# Patient Record
Sex: Female | Born: 1935
Health system: Southern US, Community
[De-identification: ages and names within clinical notes are randomized; demographics above are authoritative.]

## PROBLEM LIST (undated history)

## (undated) DIAGNOSIS — K219 Gastro-esophageal reflux disease without esophagitis: Secondary | ICD-10-CM

## (undated) DIAGNOSIS — N3281 Overactive bladder: Secondary | ICD-10-CM

## (undated) DIAGNOSIS — I2510 Atherosclerotic heart disease of native coronary artery without angina pectoris: Secondary | ICD-10-CM

## (undated) DIAGNOSIS — E782 Mixed hyperlipidemia: Secondary | ICD-10-CM

## (undated) DIAGNOSIS — R7303 Prediabetes: Secondary | ICD-10-CM

## (undated) DIAGNOSIS — I10 Essential (primary) hypertension: Secondary | ICD-10-CM

## (undated) HISTORY — DX: Prediabetes: R73.03

## (undated) HISTORY — DX: Overactive bladder: N32.81

## (undated) HISTORY — PX: CHOLECYSTECTOMY: SHX55

## (undated) HISTORY — DX: Gastro-esophageal reflux disease without esophagitis: K21.9

## (undated) HISTORY — DX: Essential (primary) hypertension: I10

## (undated) HISTORY — PX: TONSILLECTOMY: SUR1361

## (undated) HISTORY — PX: HERNIA REPAIR: SHX51

## (undated) HISTORY — PX: BACK SURGERY: SHX140

## (undated) HISTORY — PX: APPENDECTOMY: SHX54

## (undated) HISTORY — DX: Mixed hyperlipidemia: E78.2

## (undated) HISTORY — DX: Atherosclerotic heart disease of native coronary artery without angina pectoris: I25.10

---

## 2007-02-16 ENCOUNTER — Ambulatory Visit: Payer: Self-pay | Admitting: Cardiology

## 2007-02-20 ENCOUNTER — Inpatient Hospital Stay (HOSPITAL_COMMUNITY): Admission: AD | Admit: 2007-02-20 | Discharge: 2007-02-22 | Payer: Self-pay | Admitting: Cardiovascular Disease

## 2007-02-20 ENCOUNTER — Ambulatory Visit: Payer: Self-pay | Admitting: Cardiology

## 2007-02-20 ENCOUNTER — Inpatient Hospital Stay (HOSPITAL_BASED_OUTPATIENT_CLINIC_OR_DEPARTMENT_OTHER): Admission: RE | Admit: 2007-02-20 | Discharge: 2007-02-20 | Payer: Self-pay | Admitting: Cardiology

## 2007-03-10 ENCOUNTER — Ambulatory Visit: Payer: Self-pay | Admitting: Cardiology

## 2007-03-10 ENCOUNTER — Encounter: Payer: Self-pay | Admitting: Physician Assistant

## 2007-03-15 ENCOUNTER — Encounter: Payer: Self-pay | Admitting: Physician Assistant

## 2007-03-17 ENCOUNTER — Encounter: Payer: Self-pay | Admitting: Physician Assistant

## 2007-04-18 ENCOUNTER — Ambulatory Visit: Payer: Self-pay | Admitting: Cardiology

## 2007-08-03 ENCOUNTER — Encounter: Payer: Self-pay | Admitting: Cardiology

## 2007-08-25 ENCOUNTER — Ambulatory Visit: Payer: Self-pay | Admitting: Cardiology

## 2008-02-11 ENCOUNTER — Encounter: Payer: Self-pay | Admitting: Cardiology

## 2008-04-08 ENCOUNTER — Ambulatory Visit: Payer: Self-pay | Admitting: Cardiology

## 2008-04-19 ENCOUNTER — Encounter: Payer: Self-pay | Admitting: Cardiology

## 2008-05-21 ENCOUNTER — Encounter: Payer: Self-pay | Admitting: Cardiology

## 2008-10-02 ENCOUNTER — Encounter: Payer: Self-pay | Admitting: Cardiology

## 2008-11-12 DIAGNOSIS — I2510 Atherosclerotic heart disease of native coronary artery without angina pectoris: Secondary | ICD-10-CM

## 2008-11-12 DIAGNOSIS — H409 Unspecified glaucoma: Secondary | ICD-10-CM | POA: Insufficient documentation

## 2008-11-12 DIAGNOSIS — I10 Essential (primary) hypertension: Secondary | ICD-10-CM

## 2008-11-12 DIAGNOSIS — F329 Major depressive disorder, single episode, unspecified: Secondary | ICD-10-CM | POA: Insufficient documentation

## 2008-11-12 DIAGNOSIS — R0609 Other forms of dyspnea: Secondary | ICD-10-CM | POA: Insufficient documentation

## 2008-11-13 ENCOUNTER — Ambulatory Visit: Payer: Self-pay | Admitting: Cardiology

## 2008-11-13 ENCOUNTER — Encounter (INDEPENDENT_AMBULATORY_CARE_PROVIDER_SITE_OTHER): Payer: Self-pay | Admitting: *Deleted

## 2008-11-13 DIAGNOSIS — E785 Hyperlipidemia, unspecified: Secondary | ICD-10-CM

## 2008-11-21 ENCOUNTER — Encounter: Payer: Self-pay | Admitting: Physician Assistant

## 2008-11-21 ENCOUNTER — Ambulatory Visit: Payer: Self-pay | Admitting: Cardiology

## 2008-11-25 ENCOUNTER — Encounter (INDEPENDENT_AMBULATORY_CARE_PROVIDER_SITE_OTHER): Payer: Self-pay | Admitting: *Deleted

## 2008-12-19 ENCOUNTER — Ambulatory Visit: Payer: Self-pay | Admitting: Cardiology

## 2009-11-12 ENCOUNTER — Encounter: Payer: Self-pay | Admitting: Cardiology

## 2009-11-19 ENCOUNTER — Ambulatory Visit: Payer: Self-pay | Admitting: Cardiology

## 2009-11-25 ENCOUNTER — Ambulatory Visit: Payer: Self-pay | Admitting: Internal Medicine

## 2009-11-26 ENCOUNTER — Ambulatory Visit (HOSPITAL_COMMUNITY): Admission: RE | Admit: 2009-11-26 | Discharge: 2009-11-26 | Payer: Self-pay | Admitting: Internal Medicine

## 2009-12-02 ENCOUNTER — Ambulatory Visit: Payer: Self-pay | Admitting: Cardiology

## 2009-12-02 ENCOUNTER — Encounter: Payer: Self-pay | Admitting: Cardiology

## 2009-12-22 ENCOUNTER — Ambulatory Visit: Payer: Self-pay | Admitting: Cardiology

## 2010-01-05 ENCOUNTER — Ambulatory Visit: Payer: Self-pay | Admitting: Internal Medicine

## 2010-03-12 NOTE — Assessment & Plan Note (Signed)
Summary: 1 MO F/U PER 10/12 OV-JM   Visit Type:  Follow-up Primary Provider:  Dr. Kirstie Peri   History of Present Illness: 77 year old woman presents for followup. I saw her back in October. At that time she was reporting exertional fatigue, dyspnea, and wheezing with atypical chest pains. She states that since that time, symptoms have improved somewhat, although not completely resolved.  I did refer her for followup testing including PFTs and an echocardiogram, reviewed below. Results were overall reassuring.  I spoke with patient and her daughter about the possibility of considering a cardiac catheterization if her symptoms persist or progress to clearly outline the coronary anatomy and assess pulmonary pressures. At this point we were most comfortable with medical therapy and observation   Preventive Screening-Counseling & Management  Alcohol-Tobacco     Smoking Status: quit     Year Quit: 1948  Current Medications (verified): 1)  Simvastatin 10 Mg Tabs (Simvastatin) .... Take One Tablet By Mouth Daily At Bedtime 2)  Atenolol 50 Mg Tabs (Atenolol) .... Take 1 Tablet By Mouth Once A Day 3)  Amlodipine Besylate 10 Mg Tabs (Amlodipine Besylate) .... Take One Tablet By Mouth Daily 4)  Aspirin 325 Mg Tabs (Aspirin) .... Take 1 Tablet By Mouth Once A Day 5)  Vesicare 10 Mg Tabs (Solifenacin Succinate) .... Take 1 Tablet By Mouth Once A Day 6)  Vitamin B-6 100 Mg Tabs (Pyridoxine Hcl) .... Take 1 Tablet By Mouth Once A Day 7)  Fish Oil 1000 Mg Caps (Omega-3 Fatty Acids) .... Take 1 Tablet By Mouth Once A Day 8)  Red Yeast Rice 600 Mg Tabs (Red Yeast Rice Extract) .... Take 2 Tablet By Mouth Once A Day 9)  Aciphex 20 Mg Tbec (Rabeprazole Sodium) .... Take 1 Tablet By Mouth Once A Day 10)  Drisdol 98119 Unit Caps (Ergocalciferol) .... Take One By Mouth Weekly  Allergies (verified): No Known Drug Allergies  Comments:  Nurse/Medical Assistant: The patient is currently on medications  but does not know the name or dosage at this time. Instructed to contact our office with details. Will update medication list at that time.  Past History:  Past Medical History: Last updated: 12/19/2008 Hypertension  Hyperlipidemia Overactive bladder  Glaucoma  CAD - DES to LAD and circumflex January 2009, LVEF 70%  Social History: Last updated: 11/12/2008 Retired Lives at home Alcohol Use - no Drug Use - no Smoked cigarrettes as a teenager  Social History: Smoking Status:  quit  Review of Systems       The patient complains of dyspnea on exertion.  The patient denies anorexia, fever, chest pain, syncope, peripheral edema, prolonged cough, hemoptysis, melena, and hematochezia.         Otherwise reviewed and negative except as outlined.  Vital Signs:  Patient profile:   77 year old female Height:      60 inches Weight:      152 pounds Pulse rate:   67 / minute BP sitting:   134 / 83  (left arm) Cuff size:   regular  Vitals Entered By: Carlye Grippe (December 22, 2009 9:27 AM)  Physical Exam  Additional Exam:  Overweight woman in no acute distress. HEENT: Conjunctiva and lids normal, oropharynx clear. Neck: Supple, no elevated jugular venous pressure, no bruits. Lungs: Clear with diminished breath sounds, no wheezing, nonlabored. Cardiac: Regular rate and rhythm, soft systolic murmur, no S3 gallop. Abdomen: Soft, no hepatomegaly, no bruits, bowel sounds present. Skin: Warm and dry. Extremities: No  significant pitting edema, distal pulses one plus. Musculoskeletal: No gross deformities. Neuropsychiatric: Alert and oriented x3, affect appropriate.   PFT  Procedure date:  12/02/2009  Findings:      FVC 1.59, 86% predicted. FEV1 1.30, 110% predicted. Expiratory loop of flow volume loop is unremarkable. DLCO normal.  Prior Report Reviewed for Nuclear Study:  Findings: 11/21/2008 Lexiscan Cardiolite. No diagnostic ST segment changes. No perfusion defects to  indicate scar or ischemia, LVEF 80%.  Echocardiogram  Procedure date:  12/02/2009  Findings:      LVEF 60-65% with no focal wall motion abnormalities, aortic valve sclerosis without stenosis, mild mitral regurgitation, no pericardial effusion.  Impression & Recommendations:  Problem # 1:  CORONARY ATHEROSCLEROSIS NATIVE CORONARY ARTERY (ICD-414.01)  Patient reports relative symptom stability at this time on medical therapy. Recent assessment of LVEF remains normal with no major valvular abnormalities. Continue observation at this point, followup in 3 months.  Her updated medication list for this problem includes:    Atenolol 50 Mg Tabs (Atenolol) .[name]... Take 1 tablet by mouth once a day    Amlodipine Besylate 10 Mg Tabs (Amlodipine besylate) .[name]... Take one tablet by mouth daily    Aspirin 325 Mg Tabs (Aspirin) .[name]... Take 1 tablet by mouth once a day  Problem # 2:  HYPERTENSION (ICD-401.9)  No medical therapy adjustments made today.  Her updated medication list for this problem includes:    Atenolol 50 Mg Tabs (Atenolol) .[name]... Take 1 tablet by mouth once a day    Amlodipine Besylate 10 Mg Tabs (Amlodipine besylate) .[name]... Take one tablet by mouth daily    Aspirin 325 Mg Tabs (Aspirin) .[name]... Take 1 tablet by mouth once a day  Problem # 3:  SHORTNESS OF BREATH (ICD-786.05)  Recent PFTs were reassuring.  Her updated medication list for this problem includes:    Atenolol 50 Mg Tabs (Atenolol) .[name]... Take 1 tablet by mouth once a day    Amlodipine Besylate 10 Mg Tabs (Amlodipine besylate) .[name]... Take one tablet by mouth daily    Aspirin 325 Mg Tabs (Aspirin) .[name]... Take 1 tablet by mouth once a day  Patient Instructions: 1)  Your physician wants you to follow-up in: 3 months. You will receive a reminder letter in the mail one-two months in advance. If you don't receive a letter, please call our office to schedule the follow-up appointment. 2)  Your physician recommends that you continue  on your current medications as directed. Please refer to the Current Medication list given to you today.

## 2010-03-12 NOTE — Assessment & Plan Note (Signed)
Summary: 1 yr fu vs   Visit Type:  Follow-up Primary Provider:  Dr. Kirstie Peri   History of Present Illness: 77 year old woman presents for followup. She was seen back in November 2010. She is here with her daughter. Symptomatically she indicates fatigue with exertion, "wheezing" and dyspnea with exertion, atypical chest pains. Her daughter indicates that the symptoms seem more frequent. We discussed her cardiac testing from last year.  Followup labs this month showed BUN 12, creatinine 0.9, AST 28, ALT 24, cholesterol 138, triglycerides 138, HDL 50, LDL 60, sodium 138, potassium 4.5, TSH 2.2, hemoglobin 13.6, platelet was 122.  Does report indigestion and occasional cough, not typically productive. No increasing need for nitroglycerin. She reports compliance with her medications.   Current Medications (verified): 1)  Simvastatin 40 Mg Tabs (Simvastatin) .... Take One Tablet By Mouth Daily At Bedtime 2)  Atenolol 50 Mg Tabs (Atenolol) .... Take 1 Tablet By Mouth Once A Day 3)  Amlodipine Besylate 5 Mg Tabs (Amlodipine Besylate) .... Take One Tablet By Mouth Daily 4)  Aspirin 325 Mg Tabs (Aspirin) .... Take 1 Tablet By Mouth Once A Day 5)  Vesicare 10 Mg Tabs (Solifenacin Succinate) .... Take 1 Tablet By Mouth Once A Day 6)  Vitamin B-6 100 Mg Tabs (Pyridoxine Hcl) .... Take 1 Tablet By Mouth Once A Day 7)  Fish Oil 1000 Mg Caps (Omega-3 Fatty Acids) .... Take 1 Tablet By Mouth Once A Day 8)  Red Yeast Rice 600 Mg Tabs (Red Yeast Rice Extract) .... Take 2 Tablet By Mouth Once A Day 9)  Aciphex 20 Mg Tbec (Rabeprazole Sodium) .... Take 1 Tablet By Mouth Once A Day 10)  Drisdol 64332 Unit Caps (Ergocalciferol) .... Take One By Mouth Weekly  Allergies (verified): No Known Drug Allergies  Comments:  Nurse/Medical Assistant: The patient's medication bottles and allergies were reviewed with the patient and were updated in the Medication and Allergy Lists.  Past History:  Past Medical  History: Last updated: 12/19/2008 Hypertension  Hyperlipidemia Overactive bladder  Glaucoma  CAD - DES to LAD and circumflex January 2009, LVEF 70%  Social History: Last updated: 11/12/2008 Retired Lives at home Alcohol Use - no Drug Use - no Smoked cigarrettes as a teenager  Review of Systems  The patient denies anorexia, fever, weight loss, syncope, headaches, hemoptysis, melena, and hematochezia.         Otherwise reviewed and negative.  Vital Signs:  Patient profile:   77 year old female Height:      60 inches Weight:      152 pounds BMI:     29.79 Pulse rate:   60 / minute BP sitting:   147 / 93  (left arm) Cuff size:   regular  Vitals Entered By: Carlye Grippe (November 19, 2009 9:51 AM)  Nutrition Counseling: Patient's BMI is greater than 25 and therefore counseled on weight management options.  Physical Exam  Additional Exam:  Overweight woman in no acute distress. HEENT: Conjunctiva and lids normal, oropharynx clear. Neck: Supple, no elevated jugular venous pressure, no bruits. Lungs: Clear with diminished breath sounds, no wheezing, nonlabored. Cardiac: Regular rate and rhythm, soft systolic murmur, no S3 gallop. Abdomen: Soft, no hepatomegaly, no bruits, bowel sounds present. Skin: Warm and dry. Extremities: No significant pitting edema, distal pulses one plus. Musculoskeletal: No gross deformities. Neuropsychiatric: Alert and oriented x3, affect appropriate.   Echocardiogram  Procedure date:  11/21/2008  Findings:      Normal LV chamber size with  LVEF 65%, no wall motion abnormalities, mild mitral annular calcification with trace mitral regurgitation, RVSP 21 mm mercury.  Nuclear Study  Procedure date:  11/21/2008  Findings:      [employer]. No diagnostic ST segment changes. No perfusion defects to indicate scar or ischemia, LVEF 80%.  EKG  Procedure date:  11/19/2009  Findings:      Sinus bradycardia at 59 beats per minute with  nonspecific ST changes.  Impression & Recommendations:  Problem # 1:  SHORTNESS OF BREATH (ICD-786.05)  Progressive since last visit. Reportedly occasional "wheezing" also indigestion symptoms. We reviewed her previous ischemic workup from last October. Plan at this point is to proceed with a 2-D echocardiogram to reassess cardiac structure and function, and also obtain PFTs. I plan to bring her back to the office to discuss the results over the next month.  Her updated medication list for this problem includes:    Atenolol 50 Mg Tabs (Atenolol) .[name]... Take 1 tablet by mouth once a day    Amlodipine Besylate 10 Mg Tabs (Amlodipine besylate) .[name]... Take one tablet by mouth daily    Aspirin 325 Mg Tabs (Aspirin) .[name]... Take 1 tablet by mouth once a day  Orders: Pulmonary Function Test (PFT) 2-D Echocardiogram (2D Echo)  Problem # 2:  DYSLIPIDEMIA (ICD-272.4)  Lipid numbers are well controlled overall. Based on FDA dosing recommendations, will reduce simvastatin to 10 mg daily. Followup fasting lipid profile and liver function tests in the next 12 weeks.  Her updated medication list for this problem includes:    Simvastatin 10 Mg Tabs (Simvastatin) .[name]... Take one tablet by mouth daily at bedtime  Problem # 5:  HYPERTENSION (ICD-401.9)  Increase amlodipine to 10 mg daily. May also act as an antianginal.  Her updated medication list for this problem includes:    Atenolol 50 Mg Tabs (Atenolol) .[name]... Take 1 tablet by mouth once a day    Amlodipine Besylate 10 Mg Tabs (Amlodipine besylate) .[name]... Take one tablet by mouth daily    Aspirin 325 Mg Tabs (Aspirin) .[name]... Take 1 tablet by mouth once a day  Problem # 6:  CORONARY ATHEROSCLEROSIS NATIVE CORONARY ARTERY (ICD-414.01)  Status post DES to the LAD and circumflex in January 2009. Cardiolite from last October demonstrated no evidence of scar or ischemia  Her updated medication list for this problem includes:    Atenolol 50 Mg Tabs  (Atenolol) .[name]... Take 1 tablet by mouth once a day    Amlodipine Besylate 10 Mg Tabs (Amlodipine besylate) .[name]... Take one tablet by mouth daily    Aspirin 325 Mg Tabs (Aspirin) .[name]... Take 1 tablet by mouth once a day  Orders: EKG w/ Interpretation (93000) 2-D Echocardiogram (2D Echo)  Patient Instructions: 1)  Follow up with Dr. [name] on Monday, December 22, 2009 at 9:20am. 2)  Your physician has requested that you have an echocardiogram.  Echocardiography is a painless test that uses sound waves to create images of your heart. It provides your doctor with information about the size and shape of your heart and how well your heart's chambers and valves are working.  This procedure takes approximately one hour. There are no restrictions for this procedure. 3)  Your physician has recommended that you have a pulmonary function test.  Pulmonary Function Tests are a group of tests that measure how well air moves in and out of your lungs. 4)  Decrease Zocor (simvastatin) to 10mg  by mouth at bedtime. You will need a new prescription  for this. 5)  Increase Norvasc (amlodipine) to 10mg  by mouth once daily. You may take 2 of your 5 mg tablets until gone and then prescription filled for 10mg  tablets. Prescriptions: AMLODIPINE BESYLATE 10 MG TABS (AMLODIPINE BESYLATE) Take one tablet by mouth daily  #30 x 6   Entered by:   Cyril Loosen, RN, BSN   Authorized by:   Loreli Slot, MD, [facility]   Signed by:   Cyril Loosen, RN, BSN on 11/19/2009   Method used:   Electronically to        [facility]* (retail)       [address]       [place], [place]  [address]       Ph: [phone]       Fax: [phone]   RxID:   [id number] SIMVASTATIN 10 MG TABS (SIMVASTATIN) Take one tablet by mouth daily at bedtime  #30 x 6   Entered by:   Cyril Loosen, RN, BSN   Authorized by:   Loreli Slot, MD, [facility]   Signed by:   Cyril Loosen, RN, BSN on 11/19/2009   Method  used:   Electronically to        [facility]* (retail)       [address]       [place], [place]  [address]       Ph: [phone]       Fax: [phone]   RxID:   [id number]

## 2010-05-18 ENCOUNTER — Ambulatory Visit (INDEPENDENT_AMBULATORY_CARE_PROVIDER_SITE_OTHER): Payer: Medicare Other | Admitting: Internal Medicine

## 2010-05-18 DIAGNOSIS — K219 Gastro-esophageal reflux disease without esophagitis: Secondary | ICD-10-CM

## 2010-06-23 NOTE — Cardiovascular Report (Signed)
[name], [name]                ACCOUNT NO.:  [id number]   MEDICAL RECORD NO.:  [id number]          PATIENT TYPE:  INP   LOCATION:  2807                         FACILITY:  [facility]   PHYSICIAN:  Everardo Beals. Juanda Chance, MD, FACCDATE OF BIRTH:  Mar 17, 1925   DATE OF PROCEDURE:  02/21/2007  DATE OF DISCHARGE:                            CARDIAC CATHETERIZATION   CLINICAL HISTORY:  Belinda [name] is 77 years old and was seen in the [facility]  office with symptoms of shortness of breath.  Her symptoms were  suggestive of angina.  She is scheduled for evaluation with  catheterization at [facility] which showed 50% narrowing in the  distal left main, 95% narrowing in the proximal LAD and 70-80% narrowing  in the proximal circumflex artery.  We considered surgery but chose to  treat her percutaneously today.   PROCEDURE:  The procedure was performed by the right femoral artery  using an arterial sheath and 6-French CLS 3.5 guiding catheter with side  holes.  The patient given Angiomax bolus and infusion and been given  chewable aspirin and an extra 300 mg of Plavix.  A Prowater wire was  advanced down the LAD across the lesion without difficulty.  We  predilated the lesion with a 2.25 x 15-mm Maverick, performing two  inflations up to 8 atmospheres for 20 seconds.  We then deployed a 2.5 x  15-mm Promus stent, deploying this with one inflation of 14 atmospheres  for 20 seconds.  We then postdilated with a 2.75 x 12-mm Quantum  Maverick performing two inflations up to 16 atmospheres for about 20  seconds.  This did cross the first diagonal branch but did not  compromise flow in the diagonal branch.   We then approached the circumflex artery.  We passed a Prowater wire  down the circumflex artery across the lesion.  We predilated with 2.25 x  15-mm Maverick performing two inflations up to 12 atmospheres for 20  seconds to get complete expansion of the balloon.  We then deployed a  2.5 x 8-mm Promus  stent deploying this with one inflation of 14  atmospheres for 20 seconds.  Final diagnostic studies were then  performed though the guiding catheter.   The right femoral artery was closed with Angio-Seal at the end of the  procedure.  The patient tolerated the procedure well and left the  laboratory in satisfactory condition.   RESULTS:  Initially the stenosis in the proximal LAD was estimated at  95%.  Following stenting, this improved to 0%.   Initially the stenosis in the proximal circumflex artery was estimated  at 80%.  Following stenting, this improved to 0%.   CONCLUSION:  1. Successful percutaneous coronary intervention of the lesion in the      proximal left anterior descending artery using a Promus drug-      eluting stent with improvement in percent of narrowing from 90% 0%.  2. Successful percutaneous coronary intervention of the lesion in the      proximal circumflex artery using a Promus drug-eluting stent with      improvement in  percent of narrowing from 80% to 0%.   DISPOSITION:  The patient was transferred to the post angioplasty unit  for further observation.  I recommend Plavix for at least 1 year.      Bruce Elvera Lennox Juanda Chance, MD, [facility]  Electronically Signed     BRB/MEDQ  D:  02/21/2007  T:  02/21/2007  Job:  [id number]   cc:   Belinda Peri, MD  Belinda Codding, MD,FACC  Belinda Sidle, MD  Cardiopulmonary Lab

## 2010-06-23 NOTE — Assessment & Plan Note (Signed)
[facility]                          [facility] OFFICE NOTE   BREANE, GRUNWALD                       MRN:          [id number]  DATE:04/08/2008                            DOB:          30-Nov-1925    PRIMARY CARE PHYSICIAN:  Kirstie Peri, MD   REASON FOR VISIT:  Routine followup.   HISTORY OF PRESENT ILLNESS:  Ms. Henrickson comes back for a 41-month visit.  She is here with her daughter and denies having any progressive  exertional angina, although she has really not been exercising much  since the passing of her husband last year.  She still seems to be  dealing with grief.  Her electrocardiogram today shows sinus rhythm with  a prolonged PR interval of 214 milliseconds, relatively stable compared  to the last tracing.  Her medications were reviewed.  She is now no  longer on Plavix but continues with a full-dose aspirin.  She is not  having any bleeding problems.  Her lipid numbers from last June looked  fairly good with an LDL of 72 and normal liver function tests.  Today,  we talked about trying to get back to a regular exercise regimen, either  through the [facility] or cardiac rehabilitation.  She is also due for  followup lipid profile.   ALLERGIES:  No known drug allergies.   PRESENT MEDICATIONS:  1. Atenolol 50 mg p.o. daily.  2. Travatan eye drops.  3. Multivitamin.  4. Omega 3 supplements 1000 mg daily.  5. Red yeast rice extract.  6. Aspirin 325 mg p.o. daily.  7. Simvastatin 40 mg p.o. daily.  8. VESIcare 10 mg p.o. nightly.  9. Amlodipine 2.5 mg p.o. daily.  10.Sublingual nitroglycerin 0.4 mg p.r.n.   REVIEW OF SYSTEMS:  As outlined above.  Otherwise negative.   PHYSICAL EXAMINATION:  VITAL SIGNS:  Blood pressure is 130/780, heart  rate is 68, weight is 151 pounds which is up from 147 in July 2009.  GENERAL:  She is in no acute distress.  HEENT:  Conjunctiva is normal.  Oropharynx clear.  NECK:  Supple.  No elevated jugular  venous pressure.  No audible bruits.  No thyromegaly is noted.  LUNGS:  Clear without labored breathing at rest.  CARDIAC:  Regular rate and rhythm.  No loud murmur or gallop.  ABDOMEN:  Soft, nontender.  Normoactive bowel sounds.  EXTREMITIES:  Exhibits no significant pitting edema.  Distal pulses are  2+.  SKIN:  Warm and dry.  MUSCULOSKELETAL:  No kyphosis noted.  NEUROPSYCHIATRIC:  The patient is alert and oriented x3.  Affect is  appropriate.   IMPRESSION AND RECOMMENDATIONS:  1. Cardiovascular disease status post drug-eluting stent placement to      the proximal left anterior descending as well as proximal      circumflex in January 2009, associated with an ejection fraction of      70%.  Ms. Musleh is symptomatically stable, and we will plan to      continue medical therapy with followup in 6 months.  We did talk  about her basic exercise regimen, and she will look at either going      back to the [facility] more regularly or perhaps joining the cardiac      rehabilitation maintenance program.  She is not having to use any      nitroglycerin at this time.  2. Hyperlipidemia, fairly well controlled as of her last lipid profile      in June 2009.  We will plan a repeat liver function tests and      lipids.     [name], MD  Electronically Signed    SGM/MedQ  DD: 04/08/2008  DT: 04/09/2008  Job #: [id number]   cc:   Kirstie Peri, MD

## 2010-06-23 NOTE — Assessment & Plan Note (Signed)
[facility]                          [facility] NOTE   OLIVINE, HIERS                       MRN:          [id number]  DATE:04/18/2007                            DOB:          08-31-1925    PRIMARY CARE PHYSICIAN:  Kirstie Peri, MD   REASON FOR VISIT:  Cardiac followup.   HISTORY OF PRESENT ILLNESS:  Ms. Rosenstock was in the office back in  January.  Her history is detailed in the previous note.  She reports  generally an improvement in her baseline dyspnea on exertion and states  that she has [place]  She is not having any anginal chest pain.  She was  referred for followup pulmonary testing which was overall reassuring.  Her PFTs showed normal airway flow rates with mild restriction and mild  decrease in DLCO.  Her D-dimer was within normal range at 0.36.  She had  a chest x-ray done revealing no infiltrate.  She did have minimal  nodularity in the left lung apex which was felt to represent probable  small granulomas.  She has had no cough or hemoptysis.  I suspect this  may well be a chronic finding.  Today, we talked about her medications  and also some titration for better blood pressure control.   ALLERGIES:  NO KNOWN DRUG ALLERGIES.   MEDICATIONS:  1. Atenolol 50 mg p.o. daily.  2. Travatan eye drops.  3. Imipramine 25 mg p.o. t.i.d.  4. Vitamin C and B supplements.  5. Omega 3 supplements 1000 mg p.o. daily.  6. Aspirin 81 mg p.o. daily.  7. Plavix 75 mg p.o. daily.  8. Simvastatin 40 mg p.o. daily.  9. Antivert p.r.n.   REVIEW OF SYSTEMS:  As described in history of present.  She has  occasional dizziness.  No palpitations or syncope.  Otherwise systems  are negative.   PHYSICAL EXAMINATION:  Blood pressure 148/90, down to 131/80, heart rate  is 71, weight 143 pounds.  The patient is comfortable and in acute  distress.  HEENT:  Conjunctiva is normal.   Oropharynx is clear.  NECK: Neck is supple.  No elevated jugular venous pressure. No loud  bruits or thyromegaly is noted.  LUNGS:  Normal breath sounds.  No rales or wheezing.  CARDIAC:  Reveals a regular rate and rhythm.  No loud murmur or S3  gallop.  ABDOMEN:  Soft, nontender, normoactive bowel sounds.  EXTREMITIES: Show no pitting edema.  Distal pulses are 2+.  SKIN:  Warm and dry.  MUSCULOSKELETAL:  No kyphosis noted.  NEUROPSYCHIATRIC:  The patient is alert and oriented x3. Affect is  appropriate.   IMPRESSION AND RECOMMENDATIONS:  1. Multivessel coronary artery disease status post a drug-eluting      stents to the proximal left anterior descending and proximal      circumflex with hyperdynamic left ventricular ejection fraction of      70%.  At this point, we plan to continue medical therapy and I will      also add Norvasc  2.5 mg daily for better blood pressure control.      If she has any underlying diastolic dysfunction this might also be      beneficial from a symptom perspective.  I will plan to see her back      over the next 3 months.  2. The patient had a followup right groin ultrasound due to findings      of a bruit.  There was no evidence of pseudoaneurysm.  The patient      has had no pain in this area and is ambulating well.  3. History of mild hypoxemia on room air and pulmonary arterial      hypertension at catheterization.  Normal D-dimer argues against      chronic thromboembolic disease and her PFTs showed only a mild      restrictive defect.  I suspect her chest x-ray findings are      chronic.  She is not having any cough or hemoptysis.  We will plan      a follow-up chest x-ray around the time of her next visit.  She has      a remote history of tobacco use.     [name], MD  Electronically Signed    SGM/MedQ  DD: 04/18/2007  DT: 04/18/2007  Job #: [id number]   cc:   Kirstie Peri, MD

## 2010-06-23 NOTE — Assessment & Plan Note (Signed)
[facility]                          [facility] NOTE   DELTA, PICHON                       MRN:          [id number]  DATE:08/25/2007                            DOB:          [birth year]    PRIMARY CARE PHYSICIAN:  Kirstie Peri, MD   REASON FOR VISIT:  Routine followup.   HISTORY OF PRESENT ILLNESS:  Ms. Stonesifer comes back in for a 64-month  visit.  Unfortunately, her husband, Jonny Ruiz, passed away recently in late  08/11/2022 and she is obviously still grieving from this.  She is not  reporting any obvious new problems with angina or progressive  breathlessness.  She did have a followup chest x-ray in late 2022-08-11  demonstrating stable findings with emphysematous and chronic  interstitial changes.  She has been tolerating her medicines well.  I  did ask about duration of Plavix.  She had drug-eluting stents placed in  January of this year.  We would anticipate continuing at least through  January of next year.  She is not having any bleeding problems.  It does  seem that she has good family and friend support at this time.   ALLERGIES:  No known drug allergies.   PRESENT MEDICATIONS:  1. Atenolol 50 mg p.o. daily.  2. Travatan eye drops.  3. Vitamin C and B supplements.  4. Multivitamin.  5. Omega-3 supplements 1000 mg p.o. daily.  6. Red yeast rice extract.  7. Aspirin 325 mg p.o. daily.  8. Plavix 75 mg p.o. daily.  9. Simvastatin 40 mg p.o. daily.  10.Vesicare 10 mg p.o. at bedtime.  11.Amlodipine 2.5 mg p.o. daily.   REVIEW OF SYSTEMS:  As described in the history of present illness.  Otherwise, negative.   PHYSICAL EXAMINATION:  Blood pressure is 130/81, heart rate is 65, and  weight is 147 pounds.  The patient is comfortable in no acute distress.  Examination of the neck reveals no elevated jugular venous pressure.  No  loud bruits.  No thyromegaly is noted.  Lungs clear, somewhat coarse  breath sounds.  Nonlabored breathing.  No  wheezing.  Cardiac exam  reveals a regular rate and rhythm.  No pathologic murmurs or S3 gallop.  Extremities exhibit no frank pitting edema.   IMPRESSION AND RECOMMENDATIONS:  1. Multi-vessel cardiovascular disease status post drug-eluting stent      placement to the proximal left anterior descending as well as      proximal circumflex back in January 2009.  She has a left      ventricular ejection fraction of approximately 70%.  We will plan      to continue medical therapy at this point.  I will see her back for      symptom observation in 6 months.  2. History of chronic lung disease with no change in baseline      shortness of breath.  Followup chest x-ray in late 08-11-22 was stable.     [name], MD  Electronically Signed    SGM/MedQ  DD: 08/25/2007  DT: 08/25/2007  Job #: [id number]  cc:   Kirstie Peri, MD

## 2010-06-23 NOTE — Assessment & Plan Note (Signed)
[facility]                          Belinda [name]   Belinda [name], Belinda [name]                       MRN:          [id number]  DATE:03/10/2007                            DOB:          1926/01/04    PRIMARY CARDIOLOGIST:  Belinda [name].   REASON FOR VISIT:  Post hospital follow-up.   Belinda [name] is a very pleasant 77 year old female, with no prior history  of  coronary artery disease, who was recently seen here in the clinic by  Belinda [name],  Belinda [name] and Belinda [name].  She was referred to [place] by Dr.  Sherryll [name] for evaluation of exertional dyspnea with associated chest  discomfort.  Her symptoms were quite worrisome  for significant  underlying coronary artery disease and she was thus referred for an  elective cardiac catheterization.   The coronary angiogram, performed by Belinda [name], yielded  multivessel CAD with hyperdynamic LVF (70%), with no focal wall motion  abnormalities.  Of [name], there was also indication of pulmonary arterial  hypertension with associated  hypoxemia of 88-90%, on room air.  Given  these findings, Belinda [name] suspected that there was also an element of  underlying pulmonary disease and suggested possible chest CT angiography  to exclude chronic thromboembolic disease.   The patient was subsequently deemed a suitable candidate for  percutaneous intervention and thus underwent successful PCI, by Dr.  Charlies [name], with placement of 2 Belinda [name] drug-eluting stents for  treatment of a 95% focal proximal LAD and an 80% focal proximal CFX  stenosis.   Notably, there was residual 50% distal left main stenosis and  nonobstructive RCA disease.   The patient was discharged home the following day, with recommendations  to pursue additional workup for pulmonary status, with PFTs and a 6-  minute walk.   Clinically, Ms. Mclelland seems to suggest some mild improvement in her  level of exertional dyspnea, following her recent  percutaneous  intervention.  She continues to deny any appreciable associated chest  discomfort, however.  She is tolerating her medications, denies any  obvious complication of right groin incision site, and notes no easy  bruisability.   The patient does report a prior history of smoking, but states that this  was only done intermittently while she was a teenager.   CURRENT MEDICATIONS:  1. Atenolol 50 daily  2. Plavix  3. Aspirin 325 daily  4. Simvastatin 40 daily  5. Red yeast rice.  6. Fish oil 1000 daily  7. Imipramine 25 mg t.i.d.  8. Glaucoma eyedrops.   PHYSICAL EXAMINATION:  Blood pressure 138/78, pulse 66, regular weight  145.  GENERAL:  A 77 year old female sitting upright in no distress.  HEENT:  Normocephalic, atraumatic.  NECK:  Palpable carotid pulse without bruits; no JVD.  LUNGS:  Clear to auscultation all fields.  HEART:  Regular rate and rhythm (S1-S2).  No significant murmurs.  No  rubs.  ABDOMEN:  Soft, nontender with intact bowel sounds.  EXTREMITIES:  Right groin stable with no evidence of hematoma or  ecchymosis.  Palpable femoral pulse with a soft  bruit; no bruit on the  left.  Intact peripheral pulses with no edema.  NEURO:  No focal deficit  pressure.   ASSESSMENT:  1. Multivessel coronary artery disease      a.     Status post recent drug-eluting stenting (Belinda [name]) of       proximal LAD and proximal CFX.      b.     Hyperdynamic LVF (70%), with no focal wall motion       abnormalities.      c.     Transient hypoxemia, question pulmonary arterial       hypertension.  2. Right groin bruit.  3. Dyslipidemia.  4. Hypertension.  5. Remote tobacco.   PLAN:  1. Ultrasound of the right groin today, for further evaluation of      noted bruit and rule out of pseudoaneurysm.  2. Proceed with a pulmonary evaluation with baseline PFTs and 6-minute      walk with pre/post exercise room air saturation levels.  We will      also check a D-dimer level  and, if positive, consider further      evaluation with a CT angiogram of the chest to rule out chronic      thromboembolic disease..  3. Follow-up of fasting lipid/liver profile in 12 weeks.  4. Patient encouraged to participate in the cardiac rehab program, as      already recommended.  5. Schedule return clinic follow-up with myself and Belinda [name]      in 1 month, for review of study results and further      recommendations.      Gene Serpe, PA-C  Electronically Signed      Learta Codding, MD,FACC  Electronically Signed   GS/MedQ  DD: 03/10/2007  DT: 03/10/2007  Job #: [id number]   cc:   Kirstie Peri, MD

## 2010-06-23 NOTE — Discharge Summary (Signed)
NAMEKEEANNA, Belinda [name]                ACCOUNT NO.:  [id number]   MEDICAL RECORD NO.:  [id number]          PATIENT TYPE:  INP   LOCATION:  6524                         FACILITY:  [facility]   PHYSICIAN:  [name], MD DATE OF BIRTH:  05/29/1925   DATE OF ADMISSION:  02/20/2007  DATE OF DISCHARGE:  02/22/2007                               DISCHARGE SUMMARY   PRIMARY CARDIOLOGIST:  [name], M.D.   PRIMARY CARE Arlana Canizales:  Kirstie Peri, M.D. in [place].   DISCHARGE DIAGNOSIS:  Unstable angina/coronary artery disease.   SECONDARY DIAGNOSES:  1. Hypertension.  2. Hyperlipidemia.  3. Overactive bladder block.  4. Glaucoma.  5. Status post cholecystectomy.  6. Status post tonsillectomy.  7. Status post appendectomy.  8. History of lumbar spinal surgery secondary to herniated nucleus      pulposus.   ALLERGIES:  No known drug allergies.   PROCEDURES:  Left heart cardiac catheterization with successful  percutaneous coronary intervention and stenting of the left anterior  descending and left circumflex with placement of Promus drug-eluting  stents in each vessel.   HISTORY OF PRESENT ILLNESS:  A 76 year old Caucasian female without  prior history of coronary artery disease who was recently seen in clinic  by Dr. Simona Huh inn January 2009 secondary to a several month  history of progressive dyspnea on exertion with occasional chest  tightness.  Decision was made to pursue right and left heart cardiac  catheterization.   HOSPITAL COURSE:  The patient underwent right and left heart cardiac  catheterization on February 20, 2007, revealing a 90% stenosis in the  proximal LAD as well as an 80% stenosis in the left circumflex and  nonobstructive disease in the RCA.  EF was approximately 70% without  regional wall motion abnormalities.  Right heart pressures were mildly  elevated with a PA of 42/19 and a mean of 28.  Pulmonary artery  saturation was 54% while arterial  saturation was 90%.  Films were  reviewed by Dr. Juanda Chance and decision was made to pursue PCI and stenting  of the LAD and circumflex.  The LAD was successfully stented with a 2.5  x 50 mm Promus drug-eluting stent while the circumflex received a 2.5 x  8 mm Promus drug-eluting stent.  She tolerated this procedure well and  postprocedure cardiac markers have remained normal.   We will plan to discharge her home today in satisfactory condition with  plan for followup in our [place] office in approximately 2 weeks.  Given  documented relative hypoxia on right heart catheterization with mild to  moderate pulmonary hypertension, we will also plan to set up outpatient  Pulmonary evaluation in [facility] with a 6-minute walk and PFTs.   Belinda [name] will be discharged home today in good condition.   DISCHARGE LABS:  Hemoglobin 13.1, hematocrit 39.0, WBC 6.7, platelets  134, sodium 139, potassium 4.3, chloride 104, CO2 29, BUN 8, creatinine  0.96, glucose 106, INR 1.0, total bilirubin 0.5, alkaline phosphatase  72, AST 22, ALT 18, albumin 3.3, CK 52, MB 1.9, troponin 0.02, total  cholesterol 165, triglycerides  119, HDL 29, LDL 112, calcium 8.8.   DISPOSITION:  The patient is being discharged home today in good  condition.   FOLLOWUP APPOINTMENTS:  She has followup with Dr. Andee Lineman in our [place]  office on January 30, at 10:30 a.m.  She has followup with Dr. Sherryll Burger as  scheduled.  We will work on arranging Pulmonary followup when she  returns for Cardiology followup.   DISCHARGE MEDICATIONS:  1. Aspirin 325 mg daily.  2. Plavix 75 mg daily.  3. Zocor 40 mg q.h.s.  4. Atenolol 50 mg daily.  5. Imipramine 25 mg t.i.d.  6. Multivitamin one daily.  7. Fish oil 1000 mg daily.  8. Travatan 0.004% one drop both eyes q.h.s.  9. Dorzolamide 2% one drop in both eyes b.i.d.  10.Vitamin C daily.  11.Vitamin B-6 daily.  12.Red yeast rice two tabs daily.  13.Antivert 25 mg t.i.d. p.r.n.  14.Nitroglycerin 0.4 mg  sublingual p.r.n. chest pain.   OUTSTANDING LAB STUDIES:  None.   DURATION DISCHARGE ENCOUNTER:  45 minutes including physician time.      Nicolasa Ducking, ANP      [name], MD  Electronically Signed    CB/MEDQ  D:  02/22/2007  T:  02/22/2007  Job:  [id number]   cc:   Kirstie Peri, MD

## 2010-06-23 NOTE — Assessment & Plan Note (Signed)
[name]                          [facility] NOTE   Belinda [name]                       MRN:          [id number]  DATE:02/16/2007                            DOB:          August 09, 1925    REFERRING PHYSICIAN:  Ruthy Dick   PRIMARY CARE PHYSICIAN:  Dr. Kirstie Peri.   REASON FOR REFERRAL:  Shortness of breath.   HISTORY OF PRESENT ILLNESS:  [name] is a very pleasant 77 year old  female patient, wife of Belinda [name], who is followed in our practice,  who presents to the office today for further evaluation of dyspnea with  exertion.   [name] notes significant change in her level of dyspnea on exertion  over the last several months.  Her family has also noticed this.  She  describes imaging class IIB to III symptoms.  Her family notes that with  just walking out get her newspaper, she has to stop several times to  catch her breath.  She is also had to stop several times while shopping  in the grocery store.  She does note some heaviness with exertion in her  chest accompanied by shortness of breath.  She denies any radiation to  her arm or jaw.  She also notes some chest tightness from time to time.  This seems to be located bilaterally.  Seems to be associated with  lifting and taking care of her husband.  She has awoke in the middle of  the night with this before, but again it is different from the symptoms  that she feels with shortness of breath.  She denies syncope, near-  syncope.  She denies palpitations.  She denies orthopnea, PND, pedal  edema.   PAST MEDICAL HISTORY:  1. Hypertension diagnosed some 2 years ago.  2. Hyperlipidemia.  She wants to avoid statins if at all possible.  Is      currently taking red yeast rice and fish oil.  3. Overactive bladder.  4. Glaucoma.  5. Status post cholecystectomy.  6. Status post tonsillectomy.  7. Status post appendectomy.  8. History of lumbar spinal surgery secondary  to herniated nucleus      pulposus.   CURRENT MEDICATIONS:  1. Atenolol 50 mg daily.  2. Travatan eye drops.  3. Dorzolamide eye drops.  4. Imipramine 25 mg 3 times a day.  5. Vitamin C.  6. B6.  7. Multivitamin.  8. Fish oil 1000 mg daily.  9. Red yeast rice daily.  10.Aspirin p.r.n.  11.Antivert p.r.n.   ALLERGIES:  No known drug allergies.  She denies any allergies to  contrast media.   SOCIAL HISTORY:  She admits to smoking cigarettes remotely as a teenager  but has not been a long-time smoker.  She denies alcohol abuse.  She is  married.  Lives in [place] and is retired Geophysicist/field seismologist from  [employer].  She has 3 children, 4 grandchildren, and 7  great-grandchildren.   FAMILY HISTORY:  Significant for congestive heart failure in mother who  is deceased at age 71.  Father had diabetes mellitus  and CVA.  She has a  brother who died from what sounds like a thoracic aortic aneurysm  rupture in his 41s.   REVIEW OF SYSTEMS:  Please see HPI.  She denies fevers, chills, cough,  headache, sore throat, melena, hematochezia, hematuria, dysuria,  claudication symptoms, monocular blindness, unilateral weakness,  difficulty with speech, or facial droop.  She does note occasional  spinning sensation she has had for many years and has been diagnosed  with BPPV.  Rest of the systems are negative.   PHYSICAL EXAM:  She well-nourished, well-developed female in no acute  distress.  Blood pressure 141/86, pulse 75, weight 143.8 pounds.  HEENT is normal.  NECK:  Without JVD.  CARDIAC:  Normal S1, S2.  Regular rate and rhythm without murmurs rubs,  or gallops.  LUNGS:  Clear to auscultation bilaterally without wheezing, rhonchi or  rales.  ABDOMEN:  Soft and nontender.  Normoactive bowel sounds.  No  organomegaly.  No bruits.  EXTREMITIES:  Without clubbing, cyanosis or edema.  Deformity.  NEUROLOGIC:  She alert and oriented x3.  Cranial nerves 2-12 are grossly   intact.  VASCULAR:  Without carotid bruits bilaterally.  Femoral artery pulses 2+  bilateral without bruits.  Distal pulses are intact.   Electrocardiogram was sinus rhythm of 74 normal axis, no acute changes.   IMPRESSION:  1. Exertional chest discomfort and dyspnea.  Rule out anginal      equivalent.  2. Hypertension.  3. Dyslipidemia.  4. Remote family history of coronary disease.  5. Remote history of tobacco abuse.  6. Glaucoma.   PLAN:  The patient presents to office today for further evaluation of  exertional chest discomfort and shortness of breath.  Her symptoms are  suggestive of underlying coronary disease.  She could also have  diastolic dysfunction, contributing to some of her symptoms.  Proceeding  to cardiac catheterization has been discussed with the patient today.  She has also been seen by Dr. [name].  After further discussion with  her and her daughter we have decided to proceed with cardiac  catheterization to further assess her symptoms.  Risks and benefits have  been explained to the patient and her daughter, and they agreed to  proceed.  She will present to the outpatient lab for right and left  heart catheterization in the next several days.  She has been asked to  start on coated aspirin a day.  We will see her back in this office  after cardiac catheterization.      [name], PA-C  Electronically Signed      [name], MD  Electronically Signed   SW/MedQ  DD: 02/16/2007  DT: 02/16/2007  Job #: [id number]   cc:   Belinda Bass, MD

## 2010-06-23 NOTE — Cardiovascular Report (Signed)
Belinda [name], Belinda [name] NO.:  [id number]   MEDICAL RECORD NO.:  [id number]          PATIENT TYPE:  OIB   LOCATION:  1963                         FACILITY:  [facility]   PHYSICIAN:  [name], MD DATE OF BIRTH:  01-22-1926   DATE OF PROCEDURE:  02/20/2007  DATE OF DISCHARGE:                            CARDIAC CATHETERIZATION   CARDIOLOGIST:  Dr. Simona Huh   PRIMARY CARE PHYSICIAN:  Dr. Kirstie Peri   INDICATION:  Ms. Hoffmeister is a pleasant 77 year old woman with a history  of hypertension, hyperlipidemia and progressive dyspnea on exertion as  well as chest discomfort.  She is referred now for diagnostic right and  left heart catheterization to assess hemodynamics and coronary anatomy.  The potential risks and benefits were explained her in advance and  informed consent was obtained.   PROCEDURE PERFORMED:  1. Left heart catheterization.  2. Right heart catheterization.  3. Selective coronary angiography.  4. Left ventriculography.   ACCESS AND EQUIPMENT:  The area about the right femoral artery and vein  was anesthetized with 1% lidocaine.  A 7-French sheath was placed in the  right femoral vein via the modified Seldinger technique followed by a 4-  French sheath placed in the right femoral artery via the modified  Seldinger technique.  A 7-French balloon-tipped flow-directed catheter  was used for right heart catheterization and hemodynamic assessment.  Following this, 4-French JL-4 and 3-D RC catheters were used for  selective coronary angiography, followed by an angled pigtail catheter  for left heart catheterization and left ventriculography.  All of the  exchanges were made over a wire with the exception of the right heart  catheter.  A total of 70 mL of Omnipaque were used.  The patient  tolerated the procedure well without immediate complications.   HEMODYNAMICS:  Aorta 132/62 mmHg.  Left ventricle 127/11 mmHg.  Cardiac  output 3.7 by the  thermodilution method.  Cardiac index 2.5 by  thermodilution method.  Right atrium mean of 7, right ventricle 42/5.  Pulmonary artery 42/19 with a mean of 28, pulmonary capillary wedge  pressure 12.  Arterial saturation 88-90% on room air.  Pulmonary artery  saturation 54%.   ANGIOGRAPHIC FINDINGS:  1. The left main coronary artery gives rise to the left anterior      descending and circumflex vessels.  There is approximately 50%      distal stenosis noted within the left main.  2. The left anterior descending is a medium-caliber vessel with a      proximal and mid diagonal branch.  There is a focal 90% stenosis      within the proximal portion of the left anterior descending      followed by more moderate disease in the 40% range in the mid      portion of the vessel.  3. The circumflex coronary artery is medium in caliber and provides      four obtuse marginal branches, the third of which is the largest.      There is approximately 50-60% stenosis in the mid portion of the  circumflex and otherwise mild irregularities up to 20%.  There is      approximately 70% ostial stenosis involving a small obtuse marginal      in the mid circumflex distribution.  4. The right coronary artery is a medium caliber dominant vessel.      There is approximately 30% stenosis noted in the proximal portion      of the vessel followed by 30-40% stenosis within the mid to distal      vessel.  No clear flow-limiting stenoses are noted.   Left ventriculography performed in the RAO projection reveals an  ejection fraction approximately 70% with no focal wall motion  abnormality.  There is trace mitral regurgitation.   DIAGNOSES:  1. Multivessel coronary disease as outlined.  This is largely moderate      in degree and nonobstructive with the exception of a 90% focal      stenosis within the proximal left anterior descending.  2. Left ventricular ejection fraction approximately 70% with no focal       wall motion abnormality, trace mitral regurgitation, no significant      aortic valve gradient, and a left ventricular end-diastolic      pressure of 11-12 mmHg.  3. Mild to moderate elevation in pulmonary artery and right      ventricular pressures.  Suspect pulmonary arterial hypertension to      some degree.  There is also hypoxia on room air of 88-90%.  Cardiac      output and index are normal by the thermodilution method.   DISCUSSION:  I plan to review the results and films with Dr. Excell Seltzer  regarding the feasibility of percutaneous intervention to address the  left anterior descending with otherwise medical therapy anticipated.  I  suspect further pulmonary assessment will also be needed (ambulatory  oxygen saturation, possibly CT angiography to exclude chronic  thromboembolic disease, and pulmonary function tests).  This can be time  following her cardiac evaluation and management.      [name], MD  Electronically Signed     SGM/MEDQ  D:  02/20/2007  T:  02/20/2007  Job:  [id number]   cc:   Kirstie Peri, MD

## 2010-08-18 ENCOUNTER — Other Ambulatory Visit: Payer: Self-pay | Admitting: *Deleted

## 2010-08-18 MED ORDER — AMLODIPINE BESYLATE 10 MG PO TABS: 10.0000 mg | ORAL_TABLET | Freq: Every day | ORAL | Status: DC

## 2010-08-31 NOTE — Progress Notes (Signed)
  NAMEJAVIA, [name]                ACCOUNT NO.:  [id number]  MEDICAL RECORD NO.:  [id number]           PATIENT TYPE:  LOCATION:                                 FACILITY:  PHYSICIAN:  Lionel December, M.D.    DATE OF BIRTH:  [birth year]  DATE OF PROCEDURE:  05/18/2010 DATE OF DISCHARGE:                                PROGRESS NOTE   PRESENTING COMPLAINT:  Follow up for chronic GERD.  SUBJECTIVE:  Belinda [name] is 77 year old Caucasian female patient of Dr. Sherryll Burger, who is here for scheduled visit.  She was last seen in November 2011. She is accompanied by her daughter.  She states she is doing very well. She may have occasional difficulty with liquids, but has no problem with solids.  She admits to being only 50% compliant with her foods.  She does have hoarseness on most mornings, but not rest of the day.  She denies sore throat, chronic cough, abdominal pain, melena, or rectal bleeding.  Her bowels generally move daily.  She would like to decrease her Protonix to one a day and see what happens.  She also complains of intermittent imbalance and vertigo, and her daughter states that she is planning to make an appointment for to be seen by neurologist.  CURRENT MEDICATIONS: 1. Simvastatin 10 mg daily. 2. B6 one tablet daily. 3. VESIcare 10 mg bedtime. 4. Asa 325 mg daily. 5. Meclizine 25 mg daily p.r.n. 6. Norvasc 10 mg daily. 7. Atenolol 50 mg p.o. daily. 8. Fish oil 1 g daily. 9. Travatan eyedrops 1 drop to b.i.d. however, she has run out of her     prescription. 10.She has another drops, does not remember the name. 11.Pantoprazole 40 mg p.o. b.i.d. 12.MVI daily. 13.Red yeast rice 1.2 g daily.  OBJECTIVE:  VITAL SIGNS:  Weight 152 pounds, she is 58-1/2 inch tall, pulse 72 per minute, blood pressure 118/70, temp is 97.5. HEENT:  Conjunctivae is pink.  Sclerae nonicteric.  Oropharyngeal mucosa is normal.  No neck masses or thyromegaly noted. ABDOMEN:  Full.  Bowel sounds are normal.   She has mild midepigastric tenderness with no organomegaly or masses. EXTREMITIES:  No peripheral edema or clubbing noted.  ASSESSMENT:  Chronic gastroesophageal reflux disease.  She is currently on b.i.d. and still having intermittent heartburn.  She is not very compliant with her diet.  She is willing to be more compliant though.  PLAN:  We will decrease her pantoprazole to 40 mg q.a.m. and she may want to take it before lunch.  New prescription given for 30 with 11 refills.  If she has aggression of her symptoms, I am afraid she will have to go back on b.i.d. scheduled in which case her daughter will give [place] a call and she will return for OV in 1 year.     Lionel December, M.D.     NR/MEDQ  D:  05/18/2010  T:  05/19/2010  Job:  [id number]  cc:   Dr. Sherryll Burger  Electronically Signed by Lionel December M.D. on 08/31/2010 12:20:41 AM

## 2010-10-29 LAB — CBC
HCT: 38.2
HCT: 39
Hemoglobin: 12.8
Hemoglobin: 13.1
Hemoglobin: 13.1
MCHC: 33.7
MCHC: 34.2
MCV: 97.2
MCV: 97.7
MCV: 97.8
Platelets: 134 — ABNORMAL LOW
Platelets: 137 — ABNORMAL LOW
RBC: 3.89
RBC: 3.93
RDW: 13.1
WBC: 5.6
WBC: 6.7

## 2010-10-29 LAB — LIPID PANEL
Cholesterol: 165
HDL: 29 — ABNORMAL LOW
LDL Cholesterol: 112 — ABNORMAL HIGH
Total CHOL/HDL Ratio: 5.7

## 2010-10-29 LAB — POCT I-STAT 3, VENOUS BLOOD GAS (G3P V)
Acid-base deficit: 7 — ABNORMAL HIGH
Bicarbonate: 20.7
O2 Saturation: 54
Operator id: 221371
TCO2: 22
pCO2, Ven: 48.7
pH, Ven: 7.238 — ABNORMAL LOW
pO2, Ven: 34

## 2010-10-29 LAB — POCT I-STAT 3, ART BLOOD GAS (G3+)
Acid-base deficit: 3 — ABNORMAL HIGH
Bicarbonate: 22.6
Bicarbonate: 23.2
O2 Saturation: 86
O2 Saturation: 91
Operator id: 221371
TCO2: 24
TCO2: 24
pCO2 arterial: 43
pCO2 arterial: 43.4
pH, Arterial: 7.34 — ABNORMAL LOW
pO2, Arterial: 55 — ABNORMAL LOW
pO2, Arterial: 66 — ABNORMAL LOW

## 2010-10-29 LAB — BASIC METABOLIC PANEL
BUN: 8
CO2: 27
Calcium: 8.3 — ABNORMAL LOW
Chloride: 104
Creatinine, Ser: 0.8
GFR calc Af Amer: >60
Glucose, Bld: 120 — ABNORMAL HIGH
Potassium: 4.3
Sodium: 139

## 2010-10-29 LAB — CARDIAC PANEL(CRET KIN+CKTOT+MB+TROPI)
CK, MB: 1.8
Relative Index: INVALID
Total CK: 54
Troponin I: 0.02

## 2010-10-29 LAB — COMPREHENSIVE METABOLIC PANEL
ALT: 18
AST: 22
Albumin: 3.3 — ABNORMAL LOW
Alkaline Phosphatase: 72
BUN: 11
CO2: 28
Calcium: 8.2 — ABNORMAL LOW
Chloride: 104
Creatinine, Ser: 0.91
GFR calc Af Amer: >60
GFR calc non Af Amer: 60 — ABNORMAL LOW
Glucose, Bld: 113 — ABNORMAL HIGH
Potassium: 4.3
Sodium: 136
Total Bilirubin: 0.5
Total Protein: 6.3

## 2010-10-29 LAB — CK TOTAL AND CKMB (NOT AT ARMC): CK, MB: 1.9

## 2011-05-25 ENCOUNTER — Encounter (INDEPENDENT_AMBULATORY_CARE_PROVIDER_SITE_OTHER): Payer: Self-pay | Admitting: *Deleted

## 2011-05-31 ENCOUNTER — Other Ambulatory Visit (INDEPENDENT_AMBULATORY_CARE_PROVIDER_SITE_OTHER): Payer: Self-pay | Admitting: *Deleted

## 2011-05-31 ENCOUNTER — Encounter (INDEPENDENT_AMBULATORY_CARE_PROVIDER_SITE_OTHER): Payer: Self-pay | Admitting: Internal Medicine

## 2011-05-31 ENCOUNTER — Telehealth (INDEPENDENT_AMBULATORY_CARE_PROVIDER_SITE_OTHER): Payer: Self-pay | Admitting: *Deleted

## 2011-05-31 ENCOUNTER — Ambulatory Visit (INDEPENDENT_AMBULATORY_CARE_PROVIDER_SITE_OTHER): Payer: Medicare Other | Admitting: Internal Medicine

## 2011-05-31 VITALS — BP 110/60 | HR 66 | Temp 97.2°F | Ht 60.0 in | Wt 150.3 lb

## 2011-05-31 DIAGNOSIS — K219 Gastro-esophageal reflux disease without esophagitis: Secondary | ICD-10-CM

## 2011-05-31 DIAGNOSIS — Z1211 Encounter for screening for malignant neoplasm of colon: Secondary | ICD-10-CM

## 2011-05-31 MED ORDER — PEG 3350-KCL-NABCB-NACL-NASULF 236 G PO SOLR: 4.0000 L | Freq: Once | ORAL | Status: AC

## 2011-05-31 NOTE — Telephone Encounter (Signed)
Patient needs trilytely 

## 2011-05-31 NOTE — Progress Notes (Signed)
Subjective:     Patient ID: Belinda [name], female   DOB: [birth year], 78 y.o.   MRN: [id number]  HPI Belinda [name] is a 78 yr old female here today for f/u.  She tells me her acid reflux is better. She tells me is better. She occasionally has dysphagia, but not often.  Cornbread and thick foods will give her trouble.  She has to chew her food well.  She is trying to avoid spicy foods. Her daughter makes sure she drinks plenty of fluids when she eats.  She has acid reflux about every time she eats. She does not take the Protonix on a daily basis. She forgets to take it.  No abdominal pain. She has BM daily. No melena or bright red rectal bleeding.  She has never undergone a screening colonoscopy  Review of Systems  See hpi Current Outpatient Prescriptions  Medication Sig Dispense Refill  . amLODipine (NORVASC) 10 MG tablet Take 1 tablet (10 mg total) by mouth daily.  30 tablet  2  . aspirin 325 MG tablet Take 325 mg by mouth daily.      [name] atenolol (TENORMIN) 50 MG tablet Take 50 mg by mouth daily.      . fish oil-omega-3 fatty acids 1000 MG capsule Take 2 g by mouth daily.      . pantoprazole (PROTONIX) 40 MG tablet Take 40 mg by mouth daily.      . pravastatin (PRAVACHOL) 20 MG tablet Take 20 mg by mouth daily.      . sertraline (ZOLOFT) 50 MG tablet Take 50 mg by mouth daily.      . solifenacin (VESICARE) 10 MG tablet Take 5 mg by mouth daily.       Past Medical History  Diagnosis Date  . GERD (gastroesophageal reflux disease)   . Hypertension   . Glaucoma   . High cholesterol   . Borderline diabetes    Past Surgical History  Procedure Date  . Cholecystectomy   . Back surgery   . Heart stent x 2   . Appendectomy   . Tonsillectomy   . Hernia repair    History   Social History  . Marital Status: Widowed    Spouse Name: N/A    Number of Children: N/A  . Years of Education: N/A   Occupational History  . Not on file.   Social History Main Topics  . Smoking status: Never Smoker     . Smokeless tobacco: Not on file  . Alcohol Use: No  . Drug Use: No  . Sexually Active: Not on file   Other Topics Concern  . Not on file   Social History Narrative  . No narrative on file   Family Status  Relation Status Death Age  . Mother Deceased     chf  . Father Deceased     DM, CVA  . Sister Alive     good health  . Brother Deceased     MI age late 2, aneurysm    No Known Allergies      Objective:   Physical Exam.  Filed Vitals:   05/31/11 1042  Height: 5' (1.524 m)  Weight: 150 lb 4.8 oz (68.176 kg)  Alert and oriented. Skin warm and dry. Oral mucosa is moist.   . Sclera anicteric, conjunctivae is pink. Thyroid not enlarged. No cervical lymphadenopathy. Lungs clear. Heart regular rate and rhythm.  Abdomen is soft. Bowel sounds are positive. No hepatomegaly. No abdominal masses felt.  No tenderness.  No edema to lower extremities. Patient is alert and oriented.       Assessment:    Genella Rife not really controlled at this time.    Plan:     OV in one year.  Screening colonoscopy   Prilosec at evening meal. Protonix 30 minutes before breakfast.

## 2011-05-31 NOTE — Patient Instructions (Signed)
Prilosec 30 minutes before evening meal. Continue Protonix 30 minutes before breakfast.  Screening colonoscopy.

## 2011-06-11 ENCOUNTER — Telehealth: Payer: Self-pay | Admitting: *Deleted

## 2011-06-11 ENCOUNTER — Other Ambulatory Visit: Payer: Self-pay | Admitting: Cardiology

## 2011-06-11 ENCOUNTER — Encounter: Payer: Medicare Other | Admitting: Cardiology

## 2011-06-11 ENCOUNTER — Ambulatory Visit (INDEPENDENT_AMBULATORY_CARE_PROVIDER_SITE_OTHER): Payer: Medicare Other | Admitting: Cardiology

## 2011-06-11 ENCOUNTER — Encounter: Payer: Self-pay | Admitting: *Deleted

## 2011-06-11 ENCOUNTER — Encounter: Payer: Self-pay | Admitting: Cardiology

## 2011-06-11 VITALS — BP 129/79 | HR 58 | Ht 60.0 in | Wt 150.0 lb

## 2011-06-11 DIAGNOSIS — E785 Hyperlipidemia, unspecified: Secondary | ICD-10-CM

## 2011-06-11 DIAGNOSIS — I10 Essential (primary) hypertension: Secondary | ICD-10-CM

## 2011-06-11 DIAGNOSIS — R0602 Shortness of breath: Secondary | ICD-10-CM

## 2011-06-11 DIAGNOSIS — I2510 Atherosclerotic heart disease of native coronary artery without angina pectoris: Secondary | ICD-10-CM

## 2011-06-11 LAB — PROTIME-INR

## 2011-06-11 NOTE — Assessment & Plan Note (Signed)
She continues on statin therapy, followed by Dr. Sherryll Burger.

## 2011-06-11 NOTE — Patient Instructions (Signed)
Go to outpatient registration at [facility] for lab work and a chest x-ray: DO TODAY!  Your physician has requested that you have a cardiac catheterization. Cardiac catheterization is used to diagnose and/or treat various heart conditions. Doctors may recommend this procedure for a number of different reasons. The most common reason is to evaluate chest pain. Chest pain can be a symptom of coronary artery disease (CAD), and cardiac catheterization can show whether plaque is narrowing or blocking your heart's arteries. This procedure is also used to evaluate the valves, as well as measure the blood flow and oxygen levels in different parts of your heart. For further information please visit [email or web address] Please follow instruction sheet, as given.

## 2011-06-11 NOTE — Assessment & Plan Note (Signed)
Progressive as outlined above. Previous testing reviewed as noted. Plan at this point is to pursue a right and left heart catheterization to clearly define her coronary anatomy with history of prior interventions, also assess pulmonary pressures. Concerned that her shortness of breath could be reflection of coronary insufficiency. Further plans to follow.

## 2011-06-11 NOTE — Telephone Encounter (Signed)
Pre-cert for cath 06/15/11 (R) and (L) Dr. Clifton James

## 2011-06-11 NOTE — Assessment & Plan Note (Signed)
Status post DES to LAD and circumflex in January 2009. No longer on DAPT.

## 2011-06-11 NOTE — Progress Notes (Signed)
Clinical Summary Belinda [name] is an 78 y.o.female presenting for followup. She has not been seen since November 2011. She is here today with her daughter.  She continues to report progressive shortness of breath with exertion, confirmed by her daughter. NYHA class III. This was an issue described at our last visit as well. She denies any active chest pain or palpitations however.  Prior testing includes a Lexiscan Cardiolite from October 2010 demonstrating no evidence of scar or ischemia with LVEF of 80%. Echocardiogram in October of 2011 revealed LVEF of 60-65% with aortic valve sclerosis, mild mitral regurgitation, and no pericardial effusion. PFTs were reassuring in October 2011 as well.  She reports compliance with her medications. Followup ECG is nonspecific. In light of her progressive symptoms, we discussed further testing, specifically right and left heart catheterization to clearly define the coronary anatomy and assess pulmonary pressures. After reviewing the risks and benefits, she is in agreement to proceed.  No Known Allergies  Current Outpatient Prescriptions  Medication Sig Dispense Refill  . amLODipine (NORVASC) 10 MG tablet Take 1 tablet (10 mg total) by mouth daily.  30 tablet  2  . aspirin 325 MG tablet Take 325 mg by mouth daily.      . atenolol (TENORMIN) 50 MG tablet Take 50 mg by mouth daily.      . dorzolamide-timolol (COSOPT) 22.3-6.8 MG/ML ophthalmic solution Place 1 drop into both eyes 2 (two) times daily.      . fish oil-omega-3 fatty acids 1000 MG capsule Take 1 g by mouth daily.       . pantoprazole (PROTONIX) 40 MG tablet Take 40 mg by mouth daily.      . pravastatin (PRAVACHOL) 20 MG tablet Take 10 mg by mouth daily.       . sertraline (ZOLOFT) 50 MG tablet Take 50 mg by mouth daily.      . solifenacin (VESICARE) 10 MG tablet Take 10 mg by mouth daily.       . TRAVATAN Z 0.004 % SOLN ophthalmic solution Place 1 drop into both eyes At bedtime.        Past  Medical History  Diagnosis Date  . GERD (gastroesophageal reflux disease)   . Essential hypertension, benign   . Glaucoma   . Mixed hyperlipidemia   . Borderline diabetes   . Coronary atherosclerosis of native coronary artery     DES LAD and circumflex 1/09, LVEF 70%  . Overactive bladder     Social History Belinda [name] reports that she quit smoking about 65 years ago. Her smoking use included Cigarettes. She has a .2 pack-year smoking history. She has never used smokeless tobacco. Belinda [name] reports that she does not drink alcohol.  Review of Systems Occasional cough and congestion. No orthopnea or PND. Mild lower extremity edema. Otherwise negative.  Physical Examination Filed Vitals:   06/11/11 1445  BP: 129/79  Pulse: 58   Overweight woman in no acute distress.  HEENT: Conjunctiva and lids normal, oropharynx clear.  Neck: Supple, no elevated jugular venous pressure, no bruits.  Lungs: Clear with diminished breath sounds, no wheezing, nonlabored.  Cardiac: Regular rate and rhythm, soft systolic murmur, no S3 gallop.  Abdomen: Soft, no hepatomegaly, no bruits, bowel sounds present.  Skin: Warm and dry.  Extremities: No significant pitting edema, distal pulses one plus.  Musculoskeletal: No gross deformities.  Neuropsychiatric: Alert and oriented x3, affect appropriate.   ECG Reviewed in EMR.  Problem List and Plan    

## 2011-06-11 NOTE — Assessment & Plan Note (Signed)
Blood pressure is reasonably well controlled today. 

## 2011-06-11 NOTE — Telephone Encounter (Signed)
Pt has Medicare and State BCBS, no precert required.

## 2011-06-11 NOTE — Progress Notes (Signed)
This encounter was created in error - please disregard.

## 2011-06-15 ENCOUNTER — Encounter (HOSPITAL_BASED_OUTPATIENT_CLINIC_OR_DEPARTMENT_OTHER)
Admission: RE | Disposition: A | Discharge status: Home / Self Care | Payer: Self-pay | Source: Ambulatory Visit | Attending: Cardiovascular Disease

## 2011-06-15 ENCOUNTER — Inpatient Hospital Stay (HOSPITAL_BASED_OUTPATIENT_CLINIC_OR_DEPARTMENT_OTHER)
Admission: RE | Admit: 2011-06-15 | Discharge: 2011-06-15 | Disposition: A | Discharge status: Home / Self Care | Payer: Medicare Other | Source: Ambulatory Visit | Attending: Cardiovascular Disease | Admitting: Cardiovascular Disease

## 2011-06-15 DIAGNOSIS — R0989 Other specified symptoms and signs involving the circulatory and respiratory systems: Secondary | ICD-10-CM | POA: Insufficient documentation

## 2011-06-15 DIAGNOSIS — Z9861 Coronary angioplasty status: Secondary | ICD-10-CM | POA: Insufficient documentation

## 2011-06-15 DIAGNOSIS — I2510 Atherosclerotic heart disease of native coronary artery without angina pectoris: Secondary | ICD-10-CM | POA: Insufficient documentation

## 2011-06-15 DIAGNOSIS — R0609 Other forms of dyspnea: Secondary | ICD-10-CM | POA: Insufficient documentation

## 2011-06-15 LAB — POCT I-STAT 3, VENOUS BLOOD GAS (G3P V)
Acid-base deficit: 2 mmol/L (ref 0.0–2.0)
Bicarbonate: 25.4 mEq/L — ABNORMAL HIGH (ref 20.0–24.0)
O2 Saturation: 53 %
TCO2: 27 mmol/L (ref 0–100)
pO2, Ven: 31 mmHg (ref 30.0–45.0)

## 2011-06-15 LAB — POCT I-STAT 3, ART BLOOD GAS (G3+)
Bicarbonate: 22.1 mEq/L (ref 20.0–24.0)
pH, Arterial: 7.366 (ref 7.350–7.400)
pO2, Arterial: 65 mmHg — ABNORMAL LOW (ref 80.0–100.0)

## 2011-06-15 SURGERY — JV LEFT AND RIGHT HEART CATHETERIZATION WITH CORONARY ANGIOGRAM
Anesthesia: Moderate Sedation

## 2011-06-15 MED ORDER — ONDANSETRON HCL 4 MG/2ML IJ SOLN: 4.0000 mg | Freq: Four times a day (QID) | INTRAMUSCULAR | Status: DC | PRN

## 2011-06-15 MED ORDER — ACETAMINOPHEN 325 MG PO TABS: 650.0000 mg | ORAL_TABLET | ORAL | Status: DC | PRN

## 2011-06-15 MED ORDER — ASPIRIN 81 MG PO CHEW
324.0000 mg | CHEWABLE_TABLET | ORAL | Status: AC
  Administered 2011-06-15: 324 mg via ORAL

## 2011-06-15 MED ORDER — SODIUM CHLORIDE 0.9 % IV SOLN: INTRAVENOUS | Status: AC

## 2011-06-15 MED ORDER — DIAZEPAM 5 MG PO TABS
5.0000 mg | ORAL_TABLET | ORAL | Status: AC
  Administered 2011-06-15: 5 mg via ORAL

## 2011-06-15 MED ORDER — SODIUM CHLORIDE 0.9 % IV SOLN
INTRAVENOUS | Status: DC
  Administered 2011-06-15: 08:00:00 via INTRAVENOUS

## 2011-06-15 NOTE — Interval H&P Note (Signed)
History and Physical Interval Note:  06/15/2011 9:08 AM  Belinda [name]  has presented today for surgery, with the diagnosis of dyspnea  The various methods of treatment have been discussed with the patient and family. After consideration of risks, benefits and other options for treatment, the patient has consented to  Procedure(s) (LRB): JV LEFT AND RIGHT HEART CATHETERIZATION WITH CORONARY ANGIOGRAM (N/A) as a surgical intervention .  The patients' history has been reviewed, patient examined, no change in status, stable for surgery.  I have reviewed the patients' chart and labs.  Questions were answered to the patient's satisfaction.     Zadiel Leyh

## 2011-06-15 NOTE — H&P (View-Only) (Signed)
Clinical Summary Belinda [name] is an 78 y.o.female presenting for followup. She has not been seen since November 2011. She is here today with her daughter.  She continues to report progressive shortness of breath with exertion, confirmed by her daughter. NYHA class III. This was an issue described at our last visit as well. She denies any active chest pain or palpitations however.  Prior testing includes a Lexiscan Cardiolite from October 2010 demonstrating no evidence of scar or ischemia with LVEF of 80%. Echocardiogram in October of 2011 revealed LVEF of 60-65% with aortic valve sclerosis, mild mitral regurgitation, and no pericardial effusion. PFTs were reassuring in October 2011 as well.  She reports compliance with her medications. Followup ECG is nonspecific. In light of her progressive symptoms, we discussed further testing, specifically right and left heart catheterization to clearly define the coronary anatomy and assess pulmonary pressures. After reviewing the risks and benefits, she is in agreement to proceed.  No Known Allergies  Current Outpatient Prescriptions  Medication Sig Dispense Refill  . amLODipine (NORVASC) 10 MG tablet Take 1 tablet (10 mg total) by mouth daily.  30 tablet  2  . aspirin 325 MG tablet Take 325 mg by mouth daily.      [name] atenolol (TENORMIN) 50 MG tablet Take 50 mg by mouth daily.      . dorzolamide-timolol (COSOPT) 22.3-6.8 MG/ML ophthalmic solution Place 1 drop into both eyes 2 (two) times daily.      . fish oil-omega-3 fatty acids 1000 MG capsule Take 1 g by mouth daily.       . pantoprazole (PROTONIX) 40 MG tablet Take 40 mg by mouth daily.      . pravastatin (PRAVACHOL) 20 MG tablet Take 10 mg by mouth daily.       . sertraline (ZOLOFT) 50 MG tablet Take 50 mg by mouth daily.      . solifenacin (VESICARE) 10 MG tablet Take 10 mg by mouth daily.       . TRAVATAN Z 0.004 % SOLN ophthalmic solution Place 1 drop into both eyes At bedtime.        Past  Medical History  Diagnosis Date  . GERD (gastroesophageal reflux disease)   . Essential hypertension, benign   . Glaucoma   . Mixed hyperlipidemia   . Borderline diabetes   . Coronary atherosclerosis of native coronary artery     DES LAD and circumflex 1/09, LVEF 70%  . Overactive bladder     Social History [name] reports that she quit smoking about 65 years ago. Her smoking use included Cigarettes. She has a .2 pack-year smoking history. She has never used smokeless tobacco. [name] reports that she does not drink alcohol.  Review of Systems Occasional cough and congestion. No orthopnea or PND. Mild lower extremity edema. Otherwise negative.  Physical Examination Filed Vitals:   06/11/11 1445  BP: 129/79  Pulse: 58   Overweight woman in no acute distress.  HEENT: Conjunctiva and lids normal, oropharynx clear.  Neck: Supple, no elevated jugular venous pressure, no bruits.  Lungs: Clear with diminished breath sounds, no wheezing, nonlabored.  Cardiac: Regular rate and rhythm, soft systolic murmur, no S3 gallop.  Abdomen: Soft, no hepatomegaly, no bruits, bowel sounds present.  Skin: Warm and dry.  Extremities: No significant pitting edema, distal pulses one plus.  Musculoskeletal: No gross deformities.  Neuropsychiatric: Alert and oriented x3, affect appropriate.   ECG Reviewed in EMR.  Problem List and Plan

## 2011-06-15 NOTE — CV Procedure (Signed)
    Cardiac Catheterization Operative Report  Belinda [name] [id number] 5/7/201310:26 AM Kirstie Peri, MD, MD  Procedure Performed:  1. Left Heart Catheterization 2. Selective Coronary Angiography 3. Right Heart Catheterization 4. Left ventricular angiogram  Operator: Verne Carrow, MD  Indication:  Dyspnea, known CAD.                             Procedure Details: The risks, benefits, complications, treatment options, and expected outcomes were discussed with the patient. The patient and/or family concurred with the proposed plan, giving informed consent. The patient was brought to the outpatient cath lab after IV hydration was begun and oral premedication was given. The patient was further sedated with Versed and Fentanyl. The right groin was prepped and draped in the usual manner. Using the modified Seldinger access technique, a 4 French sheath was placed in the femoral artery. A 6 French sheath was inserted into the right femoral vein. A multi-purpose catheter was used to perform a right heart catheterization. Standard diagnostic catheters were used to perform selective coronary angiography. A pigtail catheter was used to perform a left ventricular angiogram. There were no immediate complications. The patient was taken to the recovery area in stable condition.   Hemodynamic Findings: Ao:   133/58               LV:  126/12/10 RA:   7             RV: 40/5/8 PA:   39/12/23 PCWP:  8 Fick Cardiac Output: 2.8 L/min Fick Cardiac Index: 1.7 L/min/m2 Central Aortic Saturation: 92% Pulmonary Artery Saturation: 53%   Angiographic Findings:  Left main: Distal 70% stenosis.   Left Anterior Descending Artery: Patent proximal stent. Just beyond the stent there is a mild plaque which appears to be due to stepdown in size from the stent. Distal vessel is patent with mild disease.   Circumflex Artery: Patent stent mid. No restenosis. OM1 is moderate sized with mild plaque disease.    Right Coronary Artery: Large, dominant vessel with mild plaque proximally, serial 40% lesions mid and mild plaque distally.   Left Ventricular Angiogram: LVEF 65-70%.   Impression: 1. Double vessel CAD with patent stents LAD and Circumflex with moderate distal Left main stenosis, mildly progressive since last cath in 2009.  2. Normal LV systolic function.   Recommendations: Will continue medical management for now. Her left main disease has progressed and could explain her dyspnea. This is not a location that we could consider stenting since it is at the very distal edge of the left main involving the LAD and the Circumflex and has calcification. Consider referral for CABG but given her age, she is probably not a good candidate for surgery. Will discuss with Dr. [name].        Complications:  None; patient tolerated the procedure well.

## 2011-06-15 NOTE — Progress Notes (Signed)
Bedrest begins @ 1045, Dr. Clifton James in to discuss results with patient and family.

## 2011-06-16 ENCOUNTER — Telehealth (INDEPENDENT_AMBULATORY_CARE_PROVIDER_SITE_OTHER): Payer: Self-pay | Admitting: *Deleted

## 2011-06-16 DIAGNOSIS — Z8719 Personal history of other diseases of the digestive system: Secondary | ICD-10-CM

## 2011-06-16 NOTE — Telephone Encounter (Signed)
[name] has requested a refill on Pantoprazole 40 mg, take 1 tablet by mouth every morning. 

## 2011-06-17 ENCOUNTER — Other Ambulatory Visit (INDEPENDENT_AMBULATORY_CARE_PROVIDER_SITE_OTHER): Payer: Self-pay | Admitting: Internal Medicine

## 2011-06-17 NOTE — Telephone Encounter (Signed)
Patient needs Trilyte sent to pharmacy

## 2011-06-18 MED ORDER — PANTOPRAZOLE SODIUM 40 MG PO TBEC: 40.0000 mg | DELAYED_RELEASE_TABLET | Freq: Every day | ORAL | Status: DC

## 2011-06-22 ENCOUNTER — Encounter (INDEPENDENT_AMBULATORY_CARE_PROVIDER_SITE_OTHER): Payer: Self-pay | Admitting: *Deleted

## 2011-06-24 ENCOUNTER — Encounter: Payer: Self-pay | Admitting: Cardiology

## 2011-06-24 ENCOUNTER — Ambulatory Visit (INDEPENDENT_AMBULATORY_CARE_PROVIDER_SITE_OTHER): Payer: Medicare Other | Admitting: Physician Assistant

## 2011-06-24 VITALS — BP 128/71 | HR 66 | Ht 60.0 in | Wt 151.0 lb

## 2011-06-24 DIAGNOSIS — R0602 Shortness of breath: Secondary | ICD-10-CM

## 2011-06-24 DIAGNOSIS — Z79899 Other long term (current) drug therapy: Secondary | ICD-10-CM

## 2011-06-24 DIAGNOSIS — I10 Essential (primary) hypertension: Secondary | ICD-10-CM

## 2011-06-24 DIAGNOSIS — E785 Hyperlipidemia, unspecified: Secondary | ICD-10-CM

## 2011-06-24 DIAGNOSIS — I2510 Atherosclerotic heart disease of native coronary artery without angina pectoris: Secondary | ICD-10-CM

## 2011-06-24 MED ORDER — PRAVASTATIN SODIUM 40 MG PO TABS: 40.0000 mg | ORAL_TABLET | Freq: Every day | ORAL | Status: DC

## 2011-06-24 MED ORDER — ISOSORBIDE MONONITRATE ER 30 MG PO TB24: 30.0000 mg | ORAL_TABLET | Freq: Every day | ORAL | Status: DC

## 2011-06-24 MED ORDER — NITROGLYCERIN 0.4 MG SL SUBL: 0.4000 mg | SUBLINGUAL_TABLET | SUBLINGUAL | Status: DC | PRN

## 2011-06-24 NOTE — Patient Instructions (Addendum)
Your physician recommends that you schedule a follow-up appointment in: 2-3 months with Dr. [name]. This appointment will be given to you at the check out desk.  Your physician has recommended you make the following change in your medication:INCREASE PRAVASTATIN TO 40 MG DAILY. You may take (2) of your 20 mg until they are finished. START ISOSORBIDE MONONITRITE (IMDUR) 30 MG DAILY. START NITROGLYCERIN 0.4 MG FOR AS NEEDED CHEST PAIN ONLY. You new prescriptions have been sent to your pharmacy.  Your physician recommends that you return for fasting lab work around August 16 th 2013 at [facility].

## 2011-06-24 NOTE — Progress Notes (Addendum)
HPI: Patient presents for post catheterization followup, following recent referral by Dr. [name] for evaluation of dyspnea, in the context of known CAD.   The results of the right/left cardiac catheterization indicated 2v CAD with patent LAD and CFX stance, but with moderate 70% distal LM stenosis, mildly progressive since prior study in 2009. EF 65-70%. CWP 8.  Clinically, her salient complaint is of DOE, with some occasional anterior chest tightness, relieved with rest. She denies any symptoms at rest. Although she has some chronic, mild pedal edema, she denies symptoms suggestive of CHF. Her daughter feels that the mother has been experiencing slowly progressive DOE for at least one year, somewhat reminiscent of her presenting symptoms in 2009, prior to undergoing percutaneous intervention.  Patient denies any complications of the right groin incision site.  12-lead EKG in office today, reviewed by me, indicates NSR with first degree AV block at 66 bpm; question old ASMI; no acute changes.  No Known Allergies  Current Outpatient Prescriptions  Medication Sig Dispense Refill  . amLODipine (NORVASC) 10 MG tablet Take 1 tablet (10 mg total) by mouth daily.  30 tablet  2  . aspirin 325 MG tablet Take 325 mg by mouth daily.      [name] atenolol (TENORMIN) 50 MG tablet Take 50 mg by mouth daily.      . dorzolamide-timolol (COSOPT) 22.3-6.8 MG/ML ophthalmic solution Place 1 drop into both eyes 2 (two) times daily.      . fish oil-omega-3 fatty acids 1000 MG capsule Take 1 g by mouth daily.       [name] GAVILYTE-G 236 G solution Take 240 mLs by mouth once. Take on June 5th day before colonoscopy.      . isosorbide mononitrate (IMDUR) 30 MG 24 hr tablet Take 1 tablet (30 mg total) by mouth daily.  30 tablet  6  . nitroGLYCERIN (NITROSTAT) 0.4 MG SL tablet Place 1 tablet (0.4 mg total) under the tongue every 5 (five) minutes as needed.  25 tablet  2  . pantoprazole (PROTONIX) 40 MG tablet Take 40 mg by mouth  daily.      . pravastatin (PRAVACHOL) 40 MG tablet Take 1 tablet (40 mg total) by mouth daily.  30 tablet  6  . sertraline (ZOLOFT) 50 MG tablet Take 50 mg by mouth daily.      . solifenacin (VESICARE) 10 MG tablet Take 10 mg by mouth daily.       . TRAVATAN Z 0.004 % SOLN ophthalmic solution Place 1 drop into both eyes At bedtime.      [name] DISCONTD: isosorbide mononitrate (IMDUR) 30 MG 24 hr tablet Take 30 mg by mouth daily.      [name] DISCONTD: nitroGLYCERIN (NITROSTAT) 0.4 MG SL tablet Place 0.4 mg under the tongue every 5 (five) minutes as needed.      [name] DISCONTD: pantoprazole (PROTONIX) 40 MG tablet Take 1 tablet (40 mg total) by mouth daily.  90 tablet  3    Past Medical History  Diagnosis Date  . GERD (gastroesophageal reflux disease)   . Essential hypertension, benign   . Glaucoma   . Mixed hyperlipidemia   . Borderline diabetes   . Coronary atherosclerosis of native coronary artery     DES LAD and circumflex 1/09, LVEF 70%  . Overactive bladder     Past Surgical History  Procedure Date  . Cholecystectomy   . Back surgery   . Appendectomy   . Tonsillectomy   . Hernia repair  History   Social History  . Marital Status: Widowed    Spouse Name: N/A    Number of Children: N/A  . Years of Education: N/A   Occupational History  . Not on file.   Social History Main Topics  . Smoking status: Former Smoker -- 0.1 packs/day for 2 years    Types: Cigarettes    Quit date: 02/08/1946  . Smokeless tobacco: Never Used  . Alcohol Use: No  . Drug Use: No  . Sexually Active: Not on file   Other Topics Concern  . Not on file   Social History Narrative  . No narrative on file    ROS: no nausea, vomiting; no fever, chills; no melena, hematochezia; no claudication  PHYSICAL EXAM: BP 128/71  Pulse 66  Ht 5' (1.524 m)  Wt 151 lb (68.493 kg)  BMI 29.49 kg/m2 GENERAL: 78 year old female; NAD HEENT: NCAT, PERRLA, EOMI; sclera clear; no xanthelasma NECK: palpable  bilateral carotid pulses, no bruits; no JVD; no TM LUNGS: CTA bilaterally CARDIAC: RRR (S1, S2); no significant murmurs; no rubs or gallops ABDOMEN: Protuberant EXTREMETIES: Palpable right femoral pulse with no hematoma, ecchymosis, or bruit; 1+ bilateral pedal edema SKIN: warm/dry; no obvious rash/lesions MUSCULOSKELETAL: no joint deformity NEURO: no focal deficit; NL affect  EKG: reviewed and available in Electronic Records  ASSESSMENT & PLAN:  SHORTNESS OF BREATH I suspect that the patient's DOE is her anginal equivalent, most likely correlating to the moderate distal LM stenosis. She has otherwise nonobstructive CAD, as well as normal LVF. She also presents with no signs/symptoms suggestive of CHF. Therefore, plan is to optimize her medication regimen by adding Imdur 30 mg daily. She is already on maximal dose of amlodipine. We'll plan on reassessing clinical status in the next 2-3 months.  CORONARY ATHEROSCLEROSIS NATIVE CORONARY ARTERY Status post recent cardiac catheterization yielding patent LAD and CFX stents, with residual moderate distal LM stenosis. Dr. Clifton James felt that this was mildly progressive, since prior study of 2009. LV function is normal. The LM lesion is not amenable to stenting, and patient is also deemed a poor candidate for CABG. Therefore, medical therapy is recommended, and we will add Imdur, as outlined above.  DYSLIPIDEMIA Will increase pravastatin to 40 mg daily, in light of recent lipid profile indicating LDL 128. Target LDL 70 or less, if feasible. We'll need followup FLP/LFT profile in 12 weeks.  HYPERTENSION Well-controlled on current medication regimen.     Gene Nieve Rojero, PAC  Attending note:  Patient seen and examined with Mr. Shara Blazing. She is well-known to me. Recent cardiac catheterization results were also reviewed. Our plan at this time is to add Imdur beginning at 30 mg daily, and otherwise continue medical therapy and observation.  Revascularization with left main stenting would be relatively high risk. Referral for CABG could ultimately be considered if she has progressive symptoms and would be in agreement. For now she prefers conservative management.  [name], M.D., F.A.C.C.

## 2011-06-24 NOTE — Assessment & Plan Note (Signed)
Will increase pravastatin to 40 mg daily, in light of recent lipid profile indicating LDL 128. Target LDL 70 or less, if feasible. We'll need followup FLP/LFT profile in 12 weeks.

## 2011-06-24 NOTE — Assessment & Plan Note (Signed)
Status post recent cardiac catheterization yielding patent LAD and CFX stents, with residual moderate distal LM stenosis. Dr. Clifton James felt that this was mildly progressive, since prior study of 2009. LV function is normal. The LM lesion is not amenable to stenting, and patient is also deemed a poor candidate for CABG. Therefore, medical therapy is recommended, and we will add Imdur, as outlined above.

## 2011-06-24 NOTE — Assessment & Plan Note (Signed)
I suspect that the patient's DOE is her anginal equivalent, most likely correlating to the moderate distal LM stenosis. She has otherwise nonobstructive CAD, as well as normal LVF. She also presents with no signs/symptoms suggestive of CHF. Therefore, plan is to optimize her medication regimen by adding Imdur 30 mg daily. She is already on maximal dose of amlodipine. We'll plan on reassessing clinical status in the next 2-3 months.

## 2011-06-24 NOTE — Assessment & Plan Note (Signed)
Well-controlled on current medication regimen 

## 2011-07-09 ENCOUNTER — Encounter (HOSPITAL_COMMUNITY): Payer: Self-pay | Admitting: Pharmacy Technician

## 2011-07-15 ENCOUNTER — Encounter (HOSPITAL_COMMUNITY): Payer: Self-pay | Admitting: *Deleted

## 2011-07-15 ENCOUNTER — Encounter (HOSPITAL_COMMUNITY)
Admission: RE | Disposition: A | Discharge status: Home / Self Care | Payer: Self-pay | Source: Ambulatory Visit | Attending: Internal Medicine

## 2011-07-15 ENCOUNTER — Ambulatory Visit (HOSPITAL_COMMUNITY)
Admission: RE | Admit: 2011-07-15 | Discharge: 2011-07-15 | Disposition: A | Discharge status: Home / Self Care | Payer: Medicare Other | Source: Ambulatory Visit | Attending: Internal Medicine | Admitting: Internal Medicine

## 2011-07-15 DIAGNOSIS — Z1211 Encounter for screening for malignant neoplasm of colon: Secondary | ICD-10-CM

## 2011-07-15 DIAGNOSIS — I10 Essential (primary) hypertension: Secondary | ICD-10-CM | POA: Insufficient documentation

## 2011-07-15 DIAGNOSIS — K5730 Diverticulosis of large intestine without perforation or abscess without bleeding: Secondary | ICD-10-CM | POA: Insufficient documentation

## 2011-07-15 HISTORY — PX: COLONOSCOPY: SHX5424

## 2011-07-15 SURGERY — COLONOSCOPY
Anesthesia: Moderate Sedation

## 2011-07-15 MED ORDER — MIDAZOLAM HCL 5 MG/5ML IJ SOLN
INTRAMUSCULAR | Status: AC
  Filled 2011-07-15: qty 10

## 2011-07-15 MED ORDER — MEPERIDINE HCL 50 MG/ML IJ SOLN
INTRAMUSCULAR | Status: AC
  Filled 2011-07-15: qty 1

## 2011-07-15 MED ORDER — STERILE WATER FOR IRRIGATION IR SOLN
Status: DC | PRN
  Administered 2011-07-15: 12:00:00

## 2011-07-15 MED ORDER — MIDAZOLAM HCL 5 MG/5ML IJ SOLN
INTRAMUSCULAR | Status: DC | PRN
  Administered 2011-07-15: 2 mg via INTRAVENOUS
  Administered 2011-07-15: 1 mg via INTRAVENOUS

## 2011-07-15 MED ORDER — MEPERIDINE HCL 50 MG/ML IJ SOLN
INTRAMUSCULAR | Status: DC | PRN
  Administered 2011-07-15: 25 mg via INTRAVENOUS

## 2011-07-15 MED ORDER — SODIUM CHLORIDE 0.45 % IV SOLN
Freq: Once | INTRAVENOUS | Status: AC
  Administered 2011-07-15: 12:00:00 via INTRAVENOUS

## 2011-07-15 NOTE — H&P (Signed)
Belinda [name] is an 78 y.o. female.   Chief Complaint: Patient is here for colonoscopy. HPI: Patient is 78 year old Caucasian female who is in for average risk screening colonoscopy. Patient has never had one before. She denies abdominal pain change in her bowel habits or rectal bleeding. Family history is negative for colorectal carcinoma.  Past Medical History  Diagnosis Date  . GERD (gastroesophageal reflux disease)   . Essential hypertension, benign   . Glaucoma   . Mixed hyperlipidemia   . Borderline diabetes   . Coronary atherosclerosis of native coronary artery     DES LAD and circumflex 1/09, LVEF 70%  . Overactive bladder     Past Surgical History  Procedure Date  . Cholecystectomy   . Back surgery   . Appendectomy   . Tonsillectomy   . Hernia repair   . Cardiac stents 2009  . Cardiac catheterization 2013    No family history on file. Social History:  reports that she quit smoking about 65 years ago. Her smoking use included Cigarettes. She has a .2 pack-year smoking history. She has never used smokeless tobacco. She reports that she does not drink alcohol or use illicit drugs.  Allergies: No Known Allergies  Medications Prior to Admission  Medication Sig Dispense Refill  . amLODipine (NORVASC) 10 MG tablet Take 1 tablet (10 mg total) by mouth daily.  30 tablet  2  . atenolol (TENORMIN) 50 MG tablet Take 50 mg by mouth daily.      . dorzolamide-timolol (COSOPT) 22.3-6.8 MG/ML ophthalmic solution Place 1 drop into both eyes 2 (two) times daily.      . fish oil-omega-3 fatty acids 1000 MG capsule Take 1 g by mouth daily.       [name] GAVILYTE-G 236 G solution Take 240 mLs by mouth once. Take on June 5th day before colonoscopy.      . isosorbide mononitrate (IMDUR) 30 MG 24 hr tablet Take 1 tablet (30 mg total) by mouth daily.  30 tablet  6  . pantoprazole (PROTONIX) 40 MG tablet Take 40 mg by mouth daily.      . pravastatin (PRAVACHOL) 40 MG tablet Take 1 tablet (40 mg  total) by mouth daily.  30 tablet  6  . sertraline (ZOLOFT) 50 MG tablet Take 50 mg by mouth daily.      . solifenacin (VESICARE) 10 MG tablet Take 10 mg by mouth daily.       . TRAVATAN Z 0.004 % SOLN ophthalmic solution Place 1 drop into both eyes At bedtime.      [name] aspirin 325 MG tablet Take 325 mg by mouth daily.      . nitroGLYCERIN (NITROSTAT) 0.4 MG SL tablet Place 1 tablet (0.4 mg total) under the tongue every 5 (five) minutes as needed.  25 tablet  2    No results found for this or any previous visit (from the past 48 hour(s)). No results found.  ROS  Blood pressure 131/63, pulse 54, temperature 98 F (36.7 C), temperature source Oral, resp. rate 22, height 5' (1.524 m), weight 151 lb (68.493 kg), SpO2 92.00%. Physical Exam  Constitutional: She appears well-developed and well-nourished.  HENT:  Mouth/Throat: Oropharynx is clear and moist.  Eyes: Conjunctivae are normal. No scleral icterus.  Neck: No thyromegaly present.  Cardiovascular: Normal rate and regular rhythm.   No murmur heard. GI: Soft. She exhibits no distension and no mass. There is no tenderness.       Small scar  at right lower quadrant  Musculoskeletal: She exhibits no edema.  Lymphadenopathy:    She has no cervical adenopathy.  Skin: Skin is warm and dry.     Assessment/Plan Average risk screening colonoscopy. This is patient's first exam  Belinda [name] 07/15/2011, 12:07 PM

## 2011-07-15 NOTE — Op Note (Signed)
COLONOSCOPY PROCEDURE REPORT  PATIENT:  Belinda [name]  MR#:  [id number] Birthdate:  [birth year], 78 y.o., female Endoscopist:  Dr. Malissa Hippo, MD Referred By:  Dr. Kirstie Peri, MD Procedure Date: 07/15/2011  Procedure:   Colonoscopy  Indications:  Patient is 78 year old Caucasian female was undergoing her first average risk screening colonoscopy.  Informed Consent:  The procedure and risks were reviewed with the patient and informed consent was obtained.  Medications:  Demerol 25 mg IV Versed 3 mg IV  Description of procedure:  After a digital rectal exam was performed, that colonoscope was advanced from the anus through the rectum and colon to the area of the cecum, ileocecal valve and appendiceal orifice. The cecum was deeply intubated. These structures were well-seen and photographed for the record. From the level of the cecum and ileocecal valve, the scope was slowly and cautiously withdrawn. The mucosal surfaces were carefully surveyed utilizing scope tip to flexion to facilitate fold flattening as needed. The scope was pulled down into the rectum where a thorough exam including retroflexion was performed.  Findings:   Prep satisfactory. Scattered diverticula throughout the colon but predominantly at sigmoid colon. No polyps or other mucosal abnormalities noted. Normal rectal mucosa. Normal anal rectal junction.  Therapeutic/Diagnostic Maneuvers Performed:  None  Complications:  None  Cecal Withdrawal Time:  10 minutes  Impression:  Pancolonic diverticulosis otherwise normal colonoscopy.   Recommendations:  Standard instructions given.  Jonae Renshaw U  07/15/2011 12:41 PM  CC: Dr. Kirstie Peri, MD, MD & Dr. Bonnetta Barry ref. provider found

## 2011-07-15 NOTE — Discharge Instructions (Signed)
Resume usual medications including aspirin. High fiber diet. No driving for 24 hours.  Diverticulosis Diverticulosis is a common condition that develops when small pouches (diverticula) form in the wall of the colon. The risk of diverticulosis increases with age. It happens more often in people who eat a low-fiber diet. Most individuals with diverticulosis have no symptoms. Those individuals with symptoms usually experience abdominal pain, constipation, or loose stools (diarrhea). HOME CARE INSTRUCTIONS  Increase the amount of fiber in your diet as directed by your caregiver or dietician. This may reduce symptoms of diverticulosis.  Your caregiver may recommend taking a dietary fiber supplement.  Drink at least 6 to 8 glasses of water each day to prevent constipation.  Try not to strain when you have a bowel movement.  Your caregiver may recommend avoiding nuts and seeds to prevent complications, although this is still an uncertain benefit.  Only take over-the-counter or prescription medicines for pain, discomfort, or fever as directed by your caregiver.  FOODS WITH HIGH FIBER CONTENT INCLUDE: Fruits. Apple, peach, pear, tangerine, raisins, prunes.  Vegetables. Brussels sprouts, asparagus, broccoli, cabbage, carrot, cauliflower, romaine lettuce, spinach, summer squash, tomato, winter squash, zucchini.  Starchy Vegetables. Baked beans, kidney beans, lima beans, split peas, lentils, potatoes (with skin).  Grains. Whole wheat bread, brown rice, bran flake cereal, plain oatmeal, white rice, shredded wheat, bran muffins.  SEEK IMMEDIATE MEDICAL CARE IF:  You develop increasing pain or severe bloating.  You have an oral temperature above 102 F (38.9 C), not controlled by medicine.  You develop vomiting or bowel movements that are bloody or black.  Document Released: 10/23/2003 Document Revised: 01/14/2011 Document Reviewed: 06/25/2009 [facility] Patient Information 2012 [place],  [place].Colonoscopy Care After Read the instructions outlined below and refer to this sheet in the next few weeks. These discharge instructions provide you with general information on caring for yourself after you leave the hospital. Your doctor may also give you specific instructions. While your treatment has been planned according to the most current medical practices available, unavoidable complications occasionally occur. If you have any problems or questions after discharge, call your doctor. HOME CARE INSTRUCTIONS ACTIVITY:  You may resume your regular activity, but move at a slower pace for the next 24 hours.   Take frequent rest periods for the next 24 hours.   Walking will help get rid of the air and reduce the bloated feeling in your belly (abdomen).   No driving for 24 hours (because of the medicine (anesthesia) used during the test).   You may shower.   Do not sign any important legal documents or operate any machinery for 24 hours (because of the anesthesia used during the test).  NUTRITION:  Drink plenty of fluids.   You may resume your normal diet as instructed by your doctor.   Begin with a light meal and progress to your normal diet. Heavy or fried foods are harder to digest and may make you feel sick to your stomach (nauseated).   Avoid alcoholic beverages for 24 hours or as instructed.  MEDICATIONS:  You may resume your normal medications unless your doctor tells you otherwise.  WHAT TO EXPECT TODAY:  Some feelings of bloating in the abdomen.   Passage of more gas than usual.   Spotting of blood in your stool or on the toilet paper.  IF YOU HAD POLYPS REMOVED DURING THE COLONOSCOPY:  No aspirin products for 7 days or as instructed.   No alcohol for 7 days or as instructed.  Eat a soft diet for the next 24 hours.  FINDING OUT THE RESULTS OF YOUR TEST Not all test results are available during your visit. If your test results are not back during the visit,  make an appointment with your caregiver to find out the results. Do not assume everything is normal if you have not heard from your caregiver or the medical facility. It is important for you to follow up on all of your test results.  SEEK IMMEDIATE MEDICAL CARE IF:  You have more than a spotting of blood in your stool.   Your belly is swollen (abdominal distention).   You are nauseated or vomiting.   You have a fever.   You have abdominal pain or discomfort that is severe or gets worse throughout the day.  Document Released: 09/09/2003 Document Revised: 01/14/2011 Document Reviewed: 09/07/2007 [facility] Patient Information 2012 [place], [place].

## 2011-07-20 ENCOUNTER — Encounter (HOSPITAL_COMMUNITY): Payer: Self-pay | Admitting: Internal Medicine

## 2011-08-20 ENCOUNTER — Encounter: Payer: Self-pay | Admitting: *Deleted

## 2011-09-27 ENCOUNTER — Encounter: Payer: Self-pay | Admitting: Cardiology

## 2011-10-04 ENCOUNTER — Ambulatory Visit (INDEPENDENT_AMBULATORY_CARE_PROVIDER_SITE_OTHER): Payer: Medicare Other | Admitting: Cardiology

## 2011-10-04 ENCOUNTER — Encounter: Payer: Self-pay | Admitting: Cardiology

## 2011-10-04 VITALS — BP 111/65 | HR 63 | Ht 59.0 in | Wt 151.8 lb

## 2011-10-04 DIAGNOSIS — I10 Essential (primary) hypertension: Secondary | ICD-10-CM

## 2011-10-04 DIAGNOSIS — I2510 Atherosclerotic heart disease of native coronary artery without angina pectoris: Secondary | ICD-10-CM

## 2011-10-04 DIAGNOSIS — E785 Hyperlipidemia, unspecified: Secondary | ICD-10-CM

## 2011-10-04 NOTE — Assessment & Plan Note (Signed)
Blood pressure very well controlled. 

## 2011-10-04 NOTE — Assessment & Plan Note (Signed)
LDL under 100 on Pravachol.

## 2011-10-04 NOTE — Patient Instructions (Addendum)
Your physician recommends that you schedule a follow-up appointment in: 4 months. You will receive a reminder letter in the mail in about 2 months reminding you to call and schedule your appointment. If you don't receive this letter, please contact our office. Your physician recommends that you continue on your current medications as directed. Please refer to the Current Medication list given to you today. 

## 2011-10-04 NOTE — Assessment & Plan Note (Signed)
Continue medical therapy and observation. If she does have oxygen desaturation with ambulation, supplemental oxygen would be very reasonable, particularly to keep her symptoms in check. We have discussed her revascularization options with distal left main disease, not an optimal candidate for CABG, and would be relatively high risk with left main PCI. For now, symptoms have not escalated.

## 2011-10-04 NOTE — Progress Notes (Signed)
Clinical Summary [name] is a 78 y.o.female presenting for followup. She was seen in May following cardiac catheterization that demonstrated patent stents in the LAD and circumflex distribution, moderate distal left main disease however that was felt to be best managed medically with limited revascularization options.  She has NYHA class 2-3 dyspnea on exertion, no angina to require nitroglycerin. She reports compliance with her medications. She and her daughter present today, states that Belinda [name] was noted to have ambulatory oxygen desaturation per visit with Dr. Sherryll Burger. She is still considering whether she would take oxygen as needed. We discussed this today.  Recent lab work showed AST 19, ALT 16, cholesterol 177, triglycerides 170, HDL 46, LDL 97.   No Known Allergies  Current Outpatient Prescriptions  Medication Sig Dispense Refill  . aspirin 325 MG tablet Take 325 mg by mouth daily.      [name] atenolol (TENORMIN) 50 MG tablet Take 50 mg by mouth daily.      . dorzolamide-timolol (COSOPT) 22.3-6.8 MG/ML ophthalmic solution Place 1 drop into both eyes 2 (two) times daily.      . fish oil-omega-3 fatty acids 1000 MG capsule Take 1 g by mouth daily.       . isosorbide mononitrate (IMDUR) 30 MG 24 hr tablet Take 1 tablet (30 mg total) by mouth daily.  30 tablet  6  . nitroGLYCERIN (NITROSTAT) 0.4 MG SL tablet Place 1 tablet (0.4 mg total) under the tongue every 5 (five) minutes as needed.  25 tablet  2  . pantoprazole (PROTONIX) 40 MG tablet Take 40 mg by mouth daily.      . pravastatin (PRAVACHOL) 40 MG tablet Take 1 tablet (40 mg total) by mouth daily.  30 tablet  6  . sertraline (ZOLOFT) 50 MG tablet Take 50 mg by mouth daily.      . solifenacin (VESICARE) 10 MG tablet Take 10 mg by mouth daily.       . TRAVATAN Z 0.004 % SOLN ophthalmic solution Place 1 drop into both eyes At bedtime.      [name] amLODipine (NORVASC) 10 MG tablet Take 1 tablet (10 mg total) by mouth daily.  30 tablet  2     Past Medical History  Diagnosis Date  . GERD (gastroesophageal reflux disease)   . Essential hypertension, benign   . Glaucoma   . Mixed hyperlipidemia   . Borderline diabetes   . Coronary atherosclerosis of native coronary artery     DES LAD and circumflex 1/09, LVEF 70%  . Overactive bladder     Past Surgical History  Procedure Date  . Cholecystectomy   . Back surgery   . Appendectomy   . Tonsillectomy   . Hernia repair   . Cardiac stents 2009  . Cardiac catheterization 2013  . Colonoscopy 07/15/2011    Procedure: COLONOSCOPY;  Surgeon: Malissa Hippo, MD;  Location: [facility];  Service: Endoscopy;  Laterality: N/A;  930    Social History Belinda [name] reports that she quit smoking about 65 years ago. Her smoking use included Cigarettes. She has a .2 pack-year smoking history. She has never used smokeless tobacco. Belinda [name] reports that she does not drink alcohol.  Review of Systems No reported bleeding problems, no palpitations or syncope. No claudication. No orthopnea. Weight has been stable. Otherwise negative.  Physical Examination Filed Vitals:   10/04/11 0841  BP: 111/65  Pulse: 63    Overweight woman in no acute distress.  HEENT: Conjunctiva and  lids normal, oropharynx clear.  Neck: Supple, no elevated jugular venous pressure, no bruits.  Lungs: Clear with diminished breath sounds, no wheezing, nonlabored.  Cardiac: Regular rate and rhythm, soft systolic murmur, no S3 gallop.  Abdomen: Soft, no hepatomegaly, no bruits, bowel sounds present.  Skin: Warm and dry.  Extremities: No significant pitting edema, distal pulses one plus.  Musculoskeletal: No gross deformities.  Neuropsychiatric: Alert and oriented x3, affect appropriate.   Problem List and Plan   CORONARY ATHEROSCLEROSIS NATIVE CORONARY ARTERY Continue medical therapy and observation. If she does have oxygen desaturation with ambulation, supplemental oxygen would be very reasonable,  particularly to keep her symptoms in check. We have discussed her revascularization options with distal left main disease, not an optimal candidate for CABG, and would be relatively high risk with left main PCI. For now, symptoms have not escalated.  HYPERTENSION Blood pressure very well controlled.  DYSLIPIDEMIA LDL under 100 on Pravachol.    [name], M.D., F.A.C.C.

## 2011-10-06 ENCOUNTER — Telehealth: Payer: Self-pay | Admitting: *Deleted

## 2011-10-06 NOTE — Telephone Encounter (Signed)
Message copied by Eustace Moore on Wed Oct 06, 2011  8:11 AM ------      Message from: [name]      Created: Sat Oct 02, 2011  8:12 AM       LDL is coming down, now 42 from 58. Continue same for now.

## 2011-10-06 NOTE — Telephone Encounter (Signed)
Patient informed. 

## 2012-01-14 ENCOUNTER — Other Ambulatory Visit: Payer: Self-pay | Admitting: *Deleted

## 2012-01-14 MED ORDER — ISOSORBIDE MONONITRATE ER 30 MG PO TB24: 30.0000 mg | ORAL_TABLET | Freq: Every day | ORAL | Status: DC

## 2012-01-14 MED ORDER — PRAVASTATIN SODIUM 40 MG PO TABS: 40.0000 mg | ORAL_TABLET | Freq: Every evening | ORAL | Status: DC

## 2012-01-28 ENCOUNTER — Ambulatory Visit (INDEPENDENT_AMBULATORY_CARE_PROVIDER_SITE_OTHER): Payer: Medicare Other | Admitting: Cardiology

## 2012-01-28 ENCOUNTER — Encounter: Payer: Self-pay | Admitting: Cardiology

## 2012-01-28 VITALS — BP 100/56 | HR 64 | Ht 59.0 in | Wt 150.0 lb

## 2012-01-28 DIAGNOSIS — I2510 Atherosclerotic heart disease of native coronary artery without angina pectoris: Secondary | ICD-10-CM

## 2012-01-28 DIAGNOSIS — E785 Hyperlipidemia, unspecified: Secondary | ICD-10-CM

## 2012-01-28 DIAGNOSIS — I10 Essential (primary) hypertension: Secondary | ICD-10-CM

## 2012-01-28 NOTE — Patient Instructions (Addendum)
Your physician recommends that you schedule a follow-up appointment in: 6 months. You will receive a reminder letter in the mail in about 4 months reminding you to call and schedule your appointment. If you don't receive this letter, please contact our office.  Your physician recommends that you continue on your current medications as directed. Please refer to the Current Medication list given to you today.  Your physician recommends that you return for a FASTING lipid/liver profile in 6 months just before your next visit. You will receive a reminder letter in the mail in about 4 months reminding you to have this done. If you don't receive this letter, please contact our office.

## 2012-01-28 NOTE — Assessment & Plan Note (Signed)
Symptomatically stable on present medical regimen. Blood pressure and heart rate are well controlled. Continue activity as tolerated. Followup arranged.

## 2012-01-28 NOTE — Assessment & Plan Note (Signed)
Reassess FLP and LFT for next visit.

## 2012-01-28 NOTE — Progress Notes (Signed)
Clinical Summary Ms. Keehan is an 78 y.o.female presenting for followup. She was seen in August. She is here with her daughter, reports no major progression in shortness of breath, no regular chest pain symptoms. She is still very hesitant to consider using home oxygen, although has had documented exertional hypoxia with her primary care provider.  She reports compliance with her medications, no significant bleeding problems. Lipids from last visit have been reviewed.   No Known Allergies  Current Outpatient Prescriptions  Medication Sig Dispense Refill  . amLODipine (NORVASC) 10 MG tablet Take 10 mg by mouth daily.      [name] aspirin 325 MG tablet Take 325 mg by mouth daily.      [name] atenolol (TENORMIN) 50 MG tablet Take 50 mg by mouth daily.      . fish oil-omega-3 fatty acids 1000 MG capsule Take 1 g by mouth daily.       . isosorbide mononitrate (IMDUR) 30 MG 24 hr tablet Take 1 tablet (30 mg total) by mouth daily.  30 tablet  6  . latanoprost (XALATAN) 0.005 % ophthalmic solution Place 1 drop into both eyes Twice daily.      . meclizine (ANTIVERT) 25 MG tablet Take 1 tablet by mouth Twice daily as needed.      . nitroGLYCERIN (NITROSTAT) 0.4 MG SL tablet Place 1 tablet (0.4 mg total) under the tongue every 5 (five) minutes as needed.  25 tablet  2  . pravastatin (PRAVACHOL) 40 MG tablet Take 1 tablet (40 mg total) by mouth every evening.  30 tablet  6  . sertraline (ZOLOFT) 50 MG tablet Take 50 mg by mouth daily.      . solifenacin (VESICARE) 10 MG tablet Take 10 mg by mouth daily.       . TRAVATAN Z 0.004 % SOLN ophthalmic solution Place 1 drop into both eyes At bedtime.        Past Medical History  Diagnosis Date  . GERD (gastroesophageal reflux disease)   . Essential hypertension, benign   . Glaucoma(365)   . Mixed hyperlipidemia   . Borderline diabetes   . Coronary atherosclerosis of native coronary artery     DES LAD and circumflex 1/09, LVEF 70%  . Overactive bladder      Social History Ms. Ager reports that she quit smoking about 66 years ago. Her smoking use included Cigarettes. She has a .2 pack-year smoking history. She has never used smokeless tobacco. Ms. Drolet reports that she does not drink alcohol.  Review of Systems No palpitations, dizziness, syncope. Appetite stable. No orthopnea or PND. Otherwise negative.  Physical Examination Filed Vitals:   01/28/12 0823  BP: 100/56  Pulse: 64   Filed Weights   01/28/12 0823  Weight: 150 lb (68.04 kg)   No acute distress.  HEENT: Conjunctiva and lids normal, oropharynx clear.  Neck: Supple, no elevated jugular venous pressure, no bruits.  Lungs: Clear with diminished breath sounds, no wheezing, nonlabored.  Cardiac: Regular rate and rhythm, soft systolic murmur, no S3 gallop.  Skin: Warm and dry.  Extremities: No significant pitting edema, distal pulses one plus.    Problem List and Plan   CORONARY ATHEROSCLEROSIS NATIVE CORONARY ARTERY Symptomatically stable on present medical regimen. Blood pressure and heart rate are well controlled. Continue activity as tolerated. Followup arranged.  DYSLIPIDEMIA Reassess FLP and LFT for next visit.  Essential hypertension, benign No change in present regimen.    [name], M.D., F.A.C.C.

## 2012-01-28 NOTE — Assessment & Plan Note (Signed)
No change in present regimen.

## 2012-05-25 ENCOUNTER — Encounter (INDEPENDENT_AMBULATORY_CARE_PROVIDER_SITE_OTHER): Payer: Self-pay | Admitting: *Deleted

## 2012-06-07 ENCOUNTER — Ambulatory Visit (INDEPENDENT_AMBULATORY_CARE_PROVIDER_SITE_OTHER): Payer: Medicare PPO | Admitting: Internal Medicine

## 2012-06-07 ENCOUNTER — Encounter (INDEPENDENT_AMBULATORY_CARE_PROVIDER_SITE_OTHER): Payer: Self-pay | Admitting: Internal Medicine

## 2012-06-07 VITALS — BP 108/66 | HR 56 | Ht 59.0 in | Wt 153.2 lb

## 2012-06-07 DIAGNOSIS — K219 Gastro-esophageal reflux disease without esophagitis: Secondary | ICD-10-CM

## 2012-06-07 MED ORDER — PANTOPRAZOLE SODIUM 40 MG PO TBEC: 40.0000 mg | DELAYED_RELEASE_TABLET | Freq: Every day | ORAL | Status: DC

## 2012-06-07 NOTE — Progress Notes (Signed)
Subjective:     Patient ID: Belinda [name], female   DOB: [birth year], 79 y.o.   MRN: [id number]  HPI Here today for f/u of her acid reflux. She says when she eats she will have acid reflux. She has a hx of GERD. Appetite is good. No weight loss.  No abdominal pain. She usually has a BM about every 2-3 days. No melena or bright red rectal bleeding. At time foods are slow to go down. Her daughter encourages her to drink a lot of water with her meals.  07/15/2011 Colonoscopy: average risk screening. Impression:  Pancolonic diverticulosis otherwise normal colonoscopy.     Review of Systems Current Outpatient Prescriptions  Medication Sig Dispense Refill  . amLODipine (NORVASC) 10 MG tablet Take 10 mg by mouth daily.      [name] aspirin 325 MG tablet Take 325 mg by mouth daily.      [name] atenolol (TENORMIN) 50 MG tablet Take 50 mg by mouth daily.      . fish oil-omega-3 fatty acids 1000 MG capsule Take 1 g by mouth daily.       . isosorbide mononitrate (IMDUR) 30 MG 24 hr tablet Take 1 tablet (30 mg total) by mouth daily.  30 tablet  6  . latanoprost (XALATAN) 0.005 % ophthalmic solution Place 1 drop into both eyes Twice daily.      . meclizine (ANTIVERT) 25 MG tablet Take 1 tablet by mouth Twice daily as needed.      . nitroGLYCERIN (NITROSTAT) 0.4 MG SL tablet Place 1 tablet (0.4 mg total) under the tongue every 5 (five) minutes as needed.  25 tablet  2  . pravastatin (PRAVACHOL) 40 MG tablet Take 1 tablet (40 mg total) by mouth every evening.  30 tablet  6  . sertraline (ZOLOFT) 50 MG tablet Take 50 mg by mouth daily.      . solifenacin (VESICARE) 10 MG tablet Take 10 mg by mouth daily.       . TRAVATAN Z 0.004 % SOLN ophthalmic solution Place 1 drop into both eyes At bedtime.       No current facility-administered medications for this visit.   Past Medical History  Diagnosis Date  . GERD (gastroesophageal reflux disease)   . Essential hypertension, benign   . Glaucoma(365)   . Mixed  hyperlipidemia   . Borderline diabetes   . Coronary atherosclerosis of native coronary artery     DES LAD and circumflex 1/09, LVEF 70%  . Overactive bladder    Past Surgical History  Procedure Laterality Date  . Cholecystectomy    . Back surgery    . Appendectomy    . Tonsillectomy    . Hernia repair    . Cardiac stents  2009  . Cardiac catheterization  2013  . Colonoscopy  07/15/2011    Procedure: COLONOSCOPY;  Surgeon: Malissa Hippo, MD;  Location: [facility];  Service: Endoscopy;  Laterality: N/A;  930   No Known Allergies      Objective:   Physical Exam Filed Vitals:   06/07/12 1021  BP: 108/66  Pulse: 56  Height: 4\' 11"  (1.499 m)  Weight: 153 lb 3.2 oz (69.491 kg)  Alert and oriented. Skin warm and dry. Oral mucosa is moist.   . Sclera anicteric, conjunctivae is pink. Thyroid not enlarged. No cervical lymphadenopathy. Lungs clear. Heart regular rate and rhythm.  Abdomen is soft. Bowel sounds are positive. No hepatomegaly. No abdominal masses felt. No tenderness.  No  edema to lower extremities.       Assessment:    GERD not controlled at this time. Protonix controlled her symptoms in the past    Plan:    Protonix 40mg  BID. OV in one year.

## 2012-06-07 NOTE — Patient Instructions (Addendum)
Protonix BID. OV in 1 yr

## 2012-09-21 ENCOUNTER — Other Ambulatory Visit: Payer: Self-pay | Admitting: Cardiology

## 2012-09-21 MED ORDER — ISOSORBIDE MONONITRATE ER 30 MG PO TB24: 30.0000 mg | ORAL_TABLET | Freq: Every day | ORAL | Status: DC

## 2012-09-21 MED ORDER — PRAVASTATIN SODIUM 40 MG PO TABS: 40.0000 mg | ORAL_TABLET | Freq: Every evening | ORAL | Status: DC

## 2012-10-17 ENCOUNTER — Encounter: Payer: Self-pay | Admitting: Cardiology

## 2012-10-18 ENCOUNTER — Encounter: Payer: Self-pay | Admitting: Cardiology

## 2012-10-30 ENCOUNTER — Encounter: Payer: Self-pay | Admitting: Cardiology

## 2012-11-08 ENCOUNTER — Ambulatory Visit (INDEPENDENT_AMBULATORY_CARE_PROVIDER_SITE_OTHER): Payer: Medicare PPO | Admitting: Cardiology

## 2012-11-08 ENCOUNTER — Encounter: Payer: Self-pay | Admitting: Cardiology

## 2012-11-08 VITALS — BP 106/69 | Ht 59.0 in | Wt 151.0 lb

## 2012-11-08 DIAGNOSIS — R0602 Shortness of breath: Secondary | ICD-10-CM

## 2012-11-08 DIAGNOSIS — I10 Essential (primary) hypertension: Secondary | ICD-10-CM

## 2012-11-08 DIAGNOSIS — I2510 Atherosclerotic heart disease of native coronary artery without angina pectoris: Secondary | ICD-10-CM

## 2012-11-08 MED ORDER — AMLODIPINE BESYLATE 10 MG PO TABS: 5.0000 mg | ORAL_TABLET | Freq: Every day | ORAL | Status: DC

## 2012-11-08 NOTE — Patient Instructions (Signed)
   Decrease Norvasc to 5mg  daily, may go back up to 10mg  daily if blood pressure starts to trend upward again Continue all other medications.   See primary MD regarding oxygen issue Your physician wants you to follow up in: 6 months.  You will receive a reminder letter in the mail one-two months in advance.  If you don't receive a letter, please call our office to schedule the follow up appointment

## 2012-11-08 NOTE — Assessment & Plan Note (Signed)
Chronic. Recent echocardiogram shows normal LVEF, no significant evidence of pulmonary hypertension with normal RVSP. Cardiac catheterization from May of last year showed patent stent sites in the LAD and circumflex, no clear evidence of pulmonary hypertension. She did have moderate left main disease that had progressed slightly from prior assessment. From a cardiac perspective plan to continue medical therapy. I have encouraged them to followup with Dr. Sherryll Burger, if in fact she has already been shown to have exertional hypoxia, prescription for oxygen supplementation may actually help her feel better.

## 2012-11-08 NOTE — Assessment & Plan Note (Signed)
Low-normal blood pressure today. Patient is describing some potential orthostatic dizziness. We will introduce Norvasc 5 mg daily for now and see if this makes her feel better.

## 2012-11-08 NOTE — Assessment & Plan Note (Signed)
Outlined above. Plan to continue medical therapy for now.

## 2012-11-08 NOTE — Progress Notes (Signed)
Clinical Summary Belinda [name] is an 79 y.o.female last seen in December 2013. She is here with her daughter. Reports NYHA class III dyspnea, also intermittent wheezing on exertion. No chest pain symptoms. Her daughter remains very concerned, they continue to see Dr. Sherryll Burger. My understanding is that she has already had prior documentation of exertional hypoxia. I have recommended that they discuss this with Dr. Sherryll Burger, and encouraged her again to consider using oxygen as needed. Her resting oximetries have been reasonable. Feels somewhat dizzy when she stands suddenly or walks for short distance. We discussed reducing her Norvasc to see if this might help.  Recent lab work shows BUN 18, creatinine 1.0, potassium 5.0, normal AST and ALT, hemoglobin 14.2, platelets 145, cholesterol 158, triglycerides 107, HDL 51, LDL 86.  Recent echocardiogram done at [facility] internal medicine reports LVEF 60-65%, mildly dilated RV, MAC with trace mitral regurgitation, mild tricuspid regurgitation with normal RVSP.  No Known Allergies  Current Outpatient Prescriptions  Medication Sig Dispense Refill  . amLODipine (NORVASC) 10 MG tablet Take 0.5 tablets (5 mg total) by mouth daily.      [name] aspirin 325 MG tablet Take 325 mg by mouth daily.      [name] atenolol (TENORMIN) 50 MG tablet Take 50 mg by mouth daily.      . fish oil-omega-3 fatty acids 1000 MG capsule Take 1 g by mouth daily.       . isosorbide mononitrate (IMDUR) 30 MG 24 hr tablet Take 1 tablet (30 mg total) by mouth daily.  30 tablet  3  . latanoprost (XALATAN) 0.005 % ophthalmic solution Place 1 drop into both eyes Twice daily.      . meclizine (ANTIVERT) 25 MG tablet Take 1 tablet by mouth Twice daily as needed.      . mirabegron ER (MYRBETRIQ) 50 MG TB24 tablet Take 50 mg by mouth daily.      . nitroGLYCERIN (NITROSTAT) 0.4 MG SL tablet Place 1 tablet (0.4 mg total) under the tongue every 5 (five) minutes as needed.  25 tablet  2  . pantoprazole (PROTONIX) 40  MG tablet Take 1 tablet (40 mg total) by mouth daily.  60 tablet  6  . pravastatin (PRAVACHOL) 40 MG tablet Take 1 tablet (40 mg total) by mouth every evening.  30 tablet  3  . sertraline (ZOLOFT) 50 MG tablet Take 50 mg by mouth daily.      . TRAVATAN Z 0.004 % SOLN ophthalmic solution Place 1 drop into both eyes At bedtime.       No current facility-administered medications for this visit.    Past Medical History  Diagnosis Date  . GERD (gastroesophageal reflux disease)   . Essential hypertension, benign   . Glaucoma   . Mixed hyperlipidemia   . Borderline diabetes   . Coronary atherosclerosis of native coronary artery     DES LAD and circumflex 1/09, LVEF 70%  . Overactive bladder     Social History Belinda [name] reports that she quit smoking about 66 years ago. Her smoking use included Cigarettes. She has a .2 pack-year smoking history. She has never used smokeless tobacco. Belinda [name] reports that she does not drink alcohol.  Review of Systems Negative except as outlined.  Physical Examination Filed Vitals:   11/08/12 1136  BP: 106/69   Filed Weights   11/08/12 1136  Weight: 151 lb (68.493 kg)    No acute distress.  HEENT: Conjunctiva and lids normal, oropharynx clear.  Neck: Supple, no elevated jugular venous pressure, no bruits.  Lungs: Clear with diminished breath sounds, no wheezing, nonlabored.  Cardiac: Regular rate and rhythm, soft systolic murmur, no S3 gallop.  Skin: Warm and dry.  Extremities: No significant pitting edema, distal pulses one plus.  Musculoskeletal: No kyphosis. Neuropsychiatric: Alert and oriented x3, affect appropriate  Problem List and Plan   Shortness of breath Chronic. Recent echocardiogram shows normal LVEF, no significant evidence of pulmonary hypertension with normal RVSP. Cardiac catheterization from May of last year showed patent stent sites in the LAD and circumflex, no clear evidence of pulmonary hypertension. She did have  moderate left main disease that had progressed slightly from prior assessment. From a cardiac perspective plan to continue medical therapy. I have encouraged them to followup with Dr. Sherryll Burger, if in fact she has already been shown to have exertional hypoxia, prescription for oxygen supplementation may actually help her feel better.  CORONARY ATHEROSCLEROSIS NATIVE CORONARY ARTERY Outlined above. Plan to continue medical therapy for now.  Essential hypertension, benign Low-normal blood pressure today. Patient is describing some potential orthostatic dizziness. We will introduce Norvasc 5 mg daily for now and see if this makes her feel better.    [name], M.D., F.A.C.C.

## 2013-01-19 ENCOUNTER — Other Ambulatory Visit: Payer: Self-pay | Admitting: Cardiology

## 2013-01-19 MED ORDER — ISOSORBIDE MONONITRATE ER 30 MG PO TB24: 30.0000 mg | ORAL_TABLET | Freq: Every day | ORAL | Status: DC

## 2013-01-19 MED ORDER — PRAVASTATIN SODIUM 40 MG PO TABS: 40.0000 mg | ORAL_TABLET | Freq: Every evening | ORAL | Status: DC

## 2013-03-07 ENCOUNTER — Encounter (INDEPENDENT_AMBULATORY_CARE_PROVIDER_SITE_OTHER): Payer: Self-pay | Admitting: *Deleted

## 2013-06-11 ENCOUNTER — Ambulatory Visit (INDEPENDENT_AMBULATORY_CARE_PROVIDER_SITE_OTHER): Payer: Medicare PPO | Admitting: Internal Medicine

## 2013-06-11 ENCOUNTER — Encounter (INDEPENDENT_AMBULATORY_CARE_PROVIDER_SITE_OTHER): Payer: Self-pay | Admitting: Internal Medicine

## 2013-06-11 VITALS — BP 108/62 | HR 66 | Temp 97.7°F | Resp 16 | Ht 59.0 in | Wt 154.8 lb

## 2013-06-11 DIAGNOSIS — K219 Gastro-esophageal reflux disease without esophagitis: Secondary | ICD-10-CM

## 2013-06-11 DIAGNOSIS — R197 Diarrhea, unspecified: Secondary | ICD-10-CM

## 2013-06-11 MED ORDER — LOPERAMIDE HCL 2 MG PO TABS: 1.0000 mg | ORAL_TABLET | Freq: Every day | ORAL | Status: DC | PRN

## 2013-06-11 MED ORDER — BENEFIBER DRINK MIX PO PACK: 4.0000 g | PACK | Freq: Every day | ORAL | Status: DC

## 2013-06-11 NOTE — Progress Notes (Signed)
Presenting complaint;  Followup for GERD. Patient complains of diarrhea.  Subjective:  Patient is an 80 year old Caucasian female who is here for yearly visit accompanied by her daughter Marylu LundJanet. She says she is doing well as for his GERD is concerned. She rarely has heartburn or regurgitation. She has intermittent bloating and strangles on liquids but this does not happen daily. She does not have difficulty swallowing solids. Appetite is good but her weight has been stable. Her new complaint is one of diarrhea which started 4-6 months ago. She is having anywhere from 2-4 stools daily. Most of stools or mushy and at times loose. She denies nocturnal diarrhea. She does have urgency and lower abdominal discomfort. She denies melena or rectal bleeding. She does not recall having been on antibiotics recently. She has noted fecal seepage   Patient's last colonoscopy was in June 2013 primary for screening purposes and revealed pancolonic diverticulosis.   Current Medications: Outpatient Encounter Prescriptions as of 06/11/2013  Medication Sig  . amLODipine (NORVASC) 10 MG tablet Take 0.5 tablets (5 mg total) by mouth daily.  [name]. aspirin 325 MG tablet Take 325 mg by mouth daily.  [name]. atenolol (TENORMIN) 50 MG tablet Take 50 mg by mouth daily.  . fish oil-omega-3 fatty acids 1000 MG capsule Take 1 g by mouth daily.   . isosorbide mononitrate (IMDUR) 30 MG 24 hr tablet Take 1 tablet (30 mg total) by mouth daily.  [name]. latanoprost (XALATAN) 0.005 % ophthalmic solution Place 1 drop into both eyes Twice daily.  . meclizine (ANTIVERT) 25 MG tablet Take 1 tablet by mouth Twice daily as needed.  . nitroGLYCERIN (NITROSTAT) 0.4 MG SL tablet Place 1 tablet (0.4 mg total) under the tongue every 5 (five) minutes as needed.  . pantoprazole (PROTONIX) 40 MG tablet Take 1 tablet (40 mg total) by mouth daily.  . pravastatin (PRAVACHOL) 40 MG tablet Take 1 tablet (40 mg total) by mouth every evening.  . sertraline (ZOLOFT)  50 MG tablet Take 50 mg by mouth daily.  . TRAVATAN Z 0.004 % SOLN ophthalmic solution Place 1 drop into both eyes At bedtime.  . [DISCONTINUED] mirabegron ER (MYRBETRIQ) 50 MG TB24 tablet Take 50 mg by mouth daily.     Objective: Blood pressure 108/62, pulse 66, temperature 97.7 F (36.5 C), temperature source Oral, resp. rate 16, height 4\' 11"  (1.499 m), weight 154 lb 12.8 oz (70.217 kg). Patient is alert and in no acute distress. Conjunctiva is pink. Sclera is nonicteric Oropharyngeal mucosa is normal. No neck masses or thyromegaly noted. Cardiac exam with regular rhythm normal S1 and S2. No murmur or gallop noted. Lungs are clear to auscultation. Abdomen is full. Bowel sounds are normal. On palpation abdomen is soft with mild tenderness in LLQ. No organomegaly or masses.  No LE edema or clubbing noted.    Assessment:  #1. GERD. Symptoms well controlled with PPI. Given that she has underlying coronary disease she is better off staying on therapy indefinitely. #2. Diarrhea of over 4 months duration. She does not have alarm symptoms. She used to be constipated before onset of diarrhea. She certainly could have IBS but other conditions need to be ruled out.   Plan:  Hemoccult x1. Benefiber 4 g by mouth daily. Imodium 1 mg by mouth daily when necessary. If Hemoccult is positive would proceed with stool studies and if stool studies are negative would consider flexible sigmoidoscopy. Patient will call with progress report end of this month. Office visit in one year.

## 2013-06-11 NOTE — Patient Instructions (Signed)
Hemoccult x1. Benefiber 4 g by mouth daily at bedtime. Imodium 1 mg by mouth daily as needed.

## 2013-06-15 ENCOUNTER — Other Ambulatory Visit: Payer: Self-pay | Admitting: Cardiology

## 2013-06-15 ENCOUNTER — Telehealth (INDEPENDENT_AMBULATORY_CARE_PROVIDER_SITE_OTHER): Payer: Self-pay | Admitting: *Deleted

## 2013-06-15 DIAGNOSIS — R109 Unspecified abdominal pain: Secondary | ICD-10-CM

## 2013-06-15 DIAGNOSIS — R197 Diarrhea, unspecified: Secondary | ICD-10-CM

## 2013-06-15 NOTE — Telephone Encounter (Signed)
   Diagnosis:    Result(s)   Card 1: Negative     Card 2:   Card 3: :    Completed by: Larose Hiresammy Acel Natzke, LPN   HEMOCCULT SENSA DEVELOPER: LOT#:  [id number] EXPIRATION DATE: 2016-05   HEMOCCULT SENSA CARD:  LOT#: [id number]  EXPIRATION DATE: 09/15   CARD CONTROL RESULTS:  POSITIVE: Positive NEGATIVE: Negative    ADDITIONAL COMMENTS: Patient was called and made aware of her results.

## 2013-06-16 NOTE — Telephone Encounter (Signed)
Stool guaiac is negative 

## 2013-07-12 ENCOUNTER — Other Ambulatory Visit (INDEPENDENT_AMBULATORY_CARE_PROVIDER_SITE_OTHER): Payer: Self-pay | Admitting: Internal Medicine

## 2013-07-13 NOTE — Telephone Encounter (Signed)
Per Dr.Rehman may fill with 5 additional refills. 

## 2013-08-24 ENCOUNTER — Ambulatory Visit: Payer: Medicare PPO | Admitting: Cardiology

## 2013-08-29 ENCOUNTER — Ambulatory Visit (INDEPENDENT_AMBULATORY_CARE_PROVIDER_SITE_OTHER): Payer: Medicare PPO | Admitting: Cardiology

## 2013-08-29 ENCOUNTER — Encounter: Payer: Self-pay | Admitting: Cardiology

## 2013-08-29 VITALS — BP 112/71 | HR 66 | Ht <= 58 in | Wt 150.0 lb

## 2013-08-29 DIAGNOSIS — E785 Hyperlipidemia, unspecified: Secondary | ICD-10-CM

## 2013-08-29 DIAGNOSIS — I209 Angina pectoris, unspecified: Secondary | ICD-10-CM

## 2013-08-29 DIAGNOSIS — I25119 Atherosclerotic heart disease of native coronary artery with unspecified angina pectoris: Secondary | ICD-10-CM

## 2013-08-29 DIAGNOSIS — I10 Essential (primary) hypertension: Secondary | ICD-10-CM

## 2013-08-29 DIAGNOSIS — I2510 Atherosclerotic heart disease of native coronary artery without angina pectoris: Secondary | ICD-10-CM

## 2013-08-29 MED ORDER — NITROGLYCERIN 0.4 MG SL SUBL: 0.4000 mg | SUBLINGUAL_TABLET | SUBLINGUAL | Status: DC | PRN

## 2013-08-29 NOTE — Progress Notes (Signed)
Clinical Summary Belinda [name] is an 80 y.o.female last seen in October 2014. She is here with her daughter. From a cardiac perspective, she does report intermittent angina symptoms. Has not been using sublingual nitroglycerin. Also uses oxygen occasionally at home, but not on a regular basis. She continues to follow with Dr. Sherryll Burger. In addition, she has been having unsteadiness to her gait, sounds like an inner ear component based on her description.  Echocardiogram from September 2014 done at [facility] internal medicine reports LVEF 60-65%, mildly dilated RV, MAC with trace mitral regurgitation, mild tricuspid regurgitation with normal RVSP.  Cardiac catheterization from May 2013 revealed patent  is within the LAD and circumflex with moderate distal left main stenosis, LVEF 65-70%, PASP 39 mmHg, PCWP 8 mmHg. She has been managed medically.  Lab work from September 2014 showed BUN 18, creatinine 1.0.   No Known Allergies  Current Outpatient Prescriptions  Medication Sig Dispense Refill  . amLODipine (NORVASC) 10 MG tablet Take 0.5 tablets (5 mg total) by mouth daily.      [name] aspirin 325 MG tablet Take 325 mg by mouth daily.      [name] atenolol (TENORMIN) 50 MG tablet Take 50 mg by mouth daily.      . fish oil-omega-3 fatty acids 1000 MG capsule Take 1 g by mouth daily.       . isosorbide mononitrate (IMDUR) 30 MG 24 hr tablet TAKE 1 TABLET BY MOUTH DAILY  30 tablet  2  . latanoprost (XALATAN) 0.005 % ophthalmic solution Place 1 drop into both eyes Twice daily.      [name] loperamide (IMODIUM A-D) 2 MG tablet Take 0.5 tablets (1 mg total) by mouth daily as needed for diarrhea or loose stools.  30 tablet  0  . meclizine (ANTIVERT) 25 MG tablet Take 1 tablet by mouth Twice daily as needed.      . nitroGLYCERIN (NITROSTAT) 0.4 MG SL tablet Place 1 tablet (0.4 mg total) under the tongue every 5 (five) minutes as needed.  25 tablet  3  . pantoprazole (PROTONIX) 40 MG tablet TAKE 1 TABLET (40 MG TOTAL) BY MOUTH  DAILY.  30 tablet  5  . pravastatin (PRAVACHOL) 40 MG tablet TAKE 1 TABLET BY MOUTH EVERY EVENING  30 tablet  2  . sertraline (ZOLOFT) 50 MG tablet Take 50 mg by mouth daily.      . TRAVATAN Z 0.004 % SOLN ophthalmic solution Place 1 drop into both eyes At bedtime.      . Wheat Dextrin (BENEFIBER DRINK MIX) PACK Take 4 g by mouth at bedtime.       No current facility-administered medications for this visit.    Past Medical History  Diagnosis Date  . GERD (gastroesophageal reflux disease)   . Essential hypertension, benign   . Glaucoma   . Mixed hyperlipidemia   . Borderline diabetes   . Coronary atherosclerosis of native coronary artery     DES LAD and circumflex 1/09, LVEF 70%  . Overactive bladder     Social History Belinda [name] reports that she quit smoking about 67 years ago. Her smoking use included Cigarettes. She has a .2 pack-year smoking history. She has never used smokeless tobacco. Belinda [name] reports that she does not drink alcohol.  Review of Systems No palpitations or syncope. Stable appetite. No fevers or chills. No claudication. Other systems reviewed and negative except as outlined.  Physical Examination Filed Vitals:   08/29/13 0822  BP: 112/71  Pulse: 66   Filed Weights   08/29/13 0822  Weight: 150 lb (68.04 kg)    No acute distress.  HEENT: Conjunctiva and lids normal, oropharynx clear.  Neck: Supple, no elevated jugular venous pressure, no bruits.  Lungs: Clear with diminished breath sounds, no wheezing, nonlabored.  Cardiac: Regular rate and rhythm, soft systolic murmur, no S3 gallop.  Skin: Warm and dry.  Extremities: No significant pitting edema, distal pulses one plus.  Musculoskeletal: No kyphosis.  Neuropsychiatric: Alert and oriented x3, affect appropriate.   Problem List and Plan   CORONARY ATHEROSCLEROSIS NATIVE CORONARY ARTERY Stable angina symptoms on medical therapy. No changes made today to current cardiac regimen. Refill provided  for fresh bottle of nitroglycerin. I encouraged her also to use her supplemental oxygen as arranged by Dr. Sherryll BurgerShah, as this will also help symptoms. Followup arranged in 6 months, sooner if needed.  Essential hypertension, benign Blood pressure well controlled today.  DYSLIPIDEMIA She continues on Pravachol.    Jonelle SidleSamuel G. Haji Delaine, M.D., F.A.C.C.

## 2013-08-29 NOTE — Assessment & Plan Note (Signed)
Blood pressure well-controlled today. 

## 2013-08-29 NOTE — Patient Instructions (Signed)

## 2013-08-29 NOTE — Assessment & Plan Note (Signed)
She continues on Pravachol. 

## 2013-08-29 NOTE — Assessment & Plan Note (Signed)
Stable angina symptoms on medical therapy. No changes made today to current cardiac regimen. Refill provided for fresh bottle of nitroglycerin. I encouraged her also to use her supplemental oxygen as arranged by Dr. Sherryll BurgerShah, as this will also help symptoms. Followup arranged in 6 months, sooner if needed.

## 2013-09-10 ENCOUNTER — Other Ambulatory Visit: Payer: Self-pay | Admitting: Cardiology

## 2014-02-06 ENCOUNTER — Encounter (INDEPENDENT_AMBULATORY_CARE_PROVIDER_SITE_OTHER): Payer: Self-pay | Admitting: *Deleted

## 2014-04-08 ENCOUNTER — Other Ambulatory Visit: Payer: Self-pay | Admitting: Cardiology

## 2014-04-15 ENCOUNTER — Telehealth: Payer: Self-pay | Admitting: Cardiology

## 2014-04-15 MED ORDER — PRAVASTATIN SODIUM 40 MG PO TABS: 40.0000 mg | ORAL_TABLET | Freq: Every evening | ORAL | Status: DC

## 2014-04-15 NOTE — Telephone Encounter (Signed)
Requesting a refill on pravastatin (PRAVACHOL) 40 MG tablet patient has appointment on 04/24/15 with Dr. Diona BrownerMcDowell.

## 2014-04-15 NOTE — Telephone Encounter (Signed)
Medication sent to pharmacy  

## 2014-04-23 ENCOUNTER — Ambulatory Visit: Payer: Medicare PPO | Admitting: Cardiology

## 2014-04-24 ENCOUNTER — Encounter: Payer: Self-pay | Admitting: Cardiology

## 2014-04-24 ENCOUNTER — Ambulatory Visit (INDEPENDENT_AMBULATORY_CARE_PROVIDER_SITE_OTHER): Payer: Medicare PPO | Admitting: Cardiology

## 2014-04-24 VITALS — BP 118/62 | HR 65 | Ht 59.0 in | Wt 159.0 lb

## 2014-04-24 DIAGNOSIS — I2510 Atherosclerotic heart disease of native coronary artery without angina pectoris: Secondary | ICD-10-CM

## 2014-04-24 DIAGNOSIS — I10 Essential (primary) hypertension: Secondary | ICD-10-CM

## 2014-04-24 DIAGNOSIS — E782 Mixed hyperlipidemia: Secondary | ICD-10-CM

## 2014-04-24 MED ORDER — ASPIRIN EC 81 MG PO TBEC: 81.0000 mg | DELAYED_RELEASE_TABLET | Freq: Every day | ORAL | Status: DC

## 2014-04-24 NOTE — Patient Instructions (Signed)
Your physician recommends that you schedule a follow-up appointment in: 6 months. You will receive a reminder letter in the mail in about 4 months reminding you to call and schedule your appointment. If you don't receive this letter, please contact our office. Your physician has recommended you make the following change in your medication:  Decrease aspirin to 81 mg daily. Continue all other medications the same. 

## 2014-04-24 NOTE — Progress Notes (Signed)
Cardiology Office Note  Date: 04/24/2014   ID: Belinda [name], DOB [birth year], MRN [id number]  PCP: Belinda Peri, MD  Primary Cardiologist: Belinda Dell, MD   Chief Complaint  Patient presents with  . Coronary Artery Disease  . Hyperlipidemia    History of Present Illness: Belinda [name] is an 81 y.o. female last seen in July 2015. She is here with her daughter today for a routine visit. Remains chronically short of breath without any significant change. She does have home oxygen now, but uses it only intermittently, usually when she is sitting down. She has been reluctant to use it when she goes out. No angina symptoms or nitroglycerin use. Follow-up ECG is stable as outlined below.  She has had some easy bruising recently, not always with any obvious precipitant. We talked about reducing her aspirin 81 mg daily. She has a visit to see Dr. Sherril [name] today.   Past Medical History  Diagnosis Date  . GERD (gastroesophageal reflux disease)   . Essential hypertension, benign   . Glaucoma   . Mixed hyperlipidemia   . Borderline diabetes   . Coronary atherosclerosis of native coronary artery     DES LAD and circumflex 1/09, LVEF 70%  . Overactive bladder     Current Outpatient Prescriptions  Medication Sig Dispense Refill  . amLODipine (NORVASC) 10 MG tablet Take 0.5 tablets (5 mg total) by mouth daily.    [name] aspirin 325 MG tablet Take 325 mg by mouth daily.    [name] atenolol (TENORMIN) 50 MG tablet Take 50 mg by mouth daily.    . fish oil-omega-3 fatty acids 1000 MG capsule Take 1 g by mouth daily.     . isosorbide mononitrate (IMDUR) 30 MG 24 hr tablet TAKE 1 TABLET BY MOUTH EVERY DAY 30 tablet 6  . latanoprost (XALATAN) 0.005 % ophthalmic solution Place 1 drop into both eyes Twice daily.    [name] loperamide (IMODIUM A-D) 2 MG tablet Take 0.5 tablets (1 mg total) by mouth daily as needed for diarrhea or loose stools. 30 tablet 0  . meclizine (ANTIVERT) 25 MG tablet Take 1 tablet by mouth  Twice daily as needed.    . nitroGLYCERIN (NITROSTAT) 0.4 MG SL tablet Place 1 tablet (0.4 mg total) under the tongue every 5 (five) minutes as needed. 25 tablet 3  . pantoprazole (PROTONIX) 40 MG tablet TAKE 1 TABLET (40 MG TOTAL) BY MOUTH DAILY. 30 tablet 5  . pravastatin (PRAVACHOL) 40 MG tablet Take 1 tablet (40 mg total) by mouth every evening. 30 tablet 1  . sertraline (ZOLOFT) 50 MG tablet Take 50 mg by mouth daily.    . TRAVATAN Z 0.004 % SOLN ophthalmic solution Place 1 drop into both eyes At bedtime.    . Vitamin D, Ergocalciferol, (DRISDOL) 50000 UNITS CAPS capsule Take 50,000 Units by mouth every 7 (seven) days.     No current facility-administered medications for this visit.    Allergies:  Review of patient's allergies indicates no known allergies.   Social History: The patient  reports that she quit smoking about 68 years ago. Her smoking use included Cigarettes. She has a .2 pack-year smoking history. She has never used smokeless tobacco. She reports that she does not drink alcohol or use illicit drugs.   ROS:  Please see the history of present illness. Otherwise, complete review of systems is positive for none.  All other systems are reviewed and negative.    Physical Exam: VS:  BP  118/62 mmHg  Pulse 65  Ht 4\' 11"  (1.499 m)  Wt 159 lb (72.122 kg)  BMI 32.10 kg/m2  SpO2 95%, BMI Body mass index is 32.1 kg/(m^2).  Wt Readings from Last 3 Encounters:  04/24/14 159 lb (72.122 kg)  08/29/13 150 lb (68.04 kg)  06/11/13 154 lb 12.8 oz (70.217 kg)     No acute distress.  HEENT: Conjunctiva and lids normal, oropharynx clear.  Neck: Supple, no elevated jugular venous pressure, no bruits.  Lungs: Clear with diminished breath sounds, no wheezing, nonlabored.  Cardiac: Regular rate and rhythm, soft systolic murmur, no S3 gallop.  Skin: Warm and dry.  Extremities: No significant pitting edema, distal pulses one plus.  Musculoskeletal: No kyphosis.  Neuropsychiatric:  Alert and oriented x3, affect appropriate.   ECG: ECG is ordered today and shows sinus rhythm with anteroseptal Q waves.  Other Studies Reviewed Today:  [facility] LVEF 60-65%, mildly dilated RV, MAC with trace mitral regurgitation, mild tricuspid regurgitation with normal RVSP.  Assessment and Plan:  1. Multivessel CAD status post DES to the LAD and circumflex, moderate left main disease that has been managed medically following her last cardiac catheterization. No escalating angina symptoms on current medical regimen. Continue observation although reduce aspirin to 81 mg daily.  2. Essential hypertension, blood pressure is normal today.  3. Hyperlipidemia, on Pravachol. Keep follow-up with primary care.   Current medicines are reviewed at length with the patient today.  Aspirin is being reduced to 81 mg daily.    Orders Placed This Encounter  Procedures  . EKG 12-Lead    Disposition: FU with me in 6 months.   Signed, Belinda SidleSamuel G. Shameek Nyquist, MD, [facility] 04/24/2014 11:56 AM    [facility] [address], [place], [place] [address] Phone: [phone]; Fax: [phone]

## 2014-06-13 ENCOUNTER — Telehealth: Payer: Self-pay | Admitting: Cardiology

## 2014-06-13 MED ORDER — PRAVASTATIN SODIUM 40 MG PO TABS: 40.0000 mg | ORAL_TABLET | Freq: Every evening | ORAL | Status: DC

## 2014-06-13 NOTE — Telephone Encounter (Signed)
Pravastatin sent on 05/08/14 with 1 refill. Medication sent to pharmacy with 6 refills

## 2014-06-13 NOTE — Telephone Encounter (Signed)
[name] states that they sent request for refill on pravastatin (PRAVACHOL) 40 MG tablet . [facility] them that patient was unknown.  Daughter would [facility].

## 2014-06-14 ENCOUNTER — Telehealth (INDEPENDENT_AMBULATORY_CARE_PROVIDER_SITE_OTHER): Payer: Self-pay | Admitting: Internal Medicine

## 2014-06-14 MED ORDER — PANTOPRAZOLE SODIUM 40 MG PO TBEC: DELAYED_RELEASE_TABLET | ORAL | Status: DC

## 2014-06-14 NOTE — Telephone Encounter (Signed)
Rx for Protonix refilled

## 2014-06-19 ENCOUNTER — Ambulatory Visit (INDEPENDENT_AMBULATORY_CARE_PROVIDER_SITE_OTHER): Payer: Medicare PPO | Admitting: Internal Medicine

## 2014-06-19 ENCOUNTER — Encounter (INDEPENDENT_AMBULATORY_CARE_PROVIDER_SITE_OTHER): Payer: Self-pay | Admitting: Internal Medicine

## 2014-06-19 VITALS — BP 102/52 | HR 60 | Temp 97.9°F | Ht 59.0 in | Wt 159.0 lb

## 2014-06-19 DIAGNOSIS — K219 Gastro-esophageal reflux disease without esophagitis: Secondary | ICD-10-CM

## 2014-06-19 DIAGNOSIS — R152 Fecal urgency: Secondary | ICD-10-CM

## 2014-06-19 NOTE — Progress Notes (Addendum)
Subjective:    Patient ID: Belinda LamerNancy F [name], female    DOB: 12-19-1925, 81 y.o.   MRN: [id number]  HPI Here today for f/u. She says she is doing good. She occasionally has diarrhea. Sometimes she can't make it to the bathroom. She has diarrhea every couple of day. She usually has a BM x 2 a day. She occasionally has acid reflux.  She occasionally has some dysphagia to liquids.  She cuts her meats up well. She eats in small amts. No melena or BRRB. Sometimes she is SOB and sometimes she is off balance.  Her last colonoscopy in 2013 was normal per Dr. Karilyn Cotaehman notes (screening and 1st) She does not exercise. Review of Systems Widowed. Three children. One has had mini strokes.     Past Medical History  Diagnosis Date  . GERD (gastroesophageal reflux disease)   . Essential hypertension, benign   . Glaucoma   . Mixed hyperlipidemia   . Borderline diabetes   . Coronary atherosclerosis of native coronary artery     DES LAD and circumflex 1/09, LVEF 70%  . Overactive bladder     Past Surgical History  Procedure Laterality Date  . Cholecystectomy    . Back surgery    . Appendectomy    . Tonsillectomy    . Hernia repair    . Colonoscopy  07/15/2011    Procedure: COLONOSCOPY;  Surgeon: Malissa HippoNajeeb U Rehman, MD;  Location: [facility];  Service: Endoscopy;  Laterality: N/A;  930    No Known Allergies  Current Outpatient Prescriptions on File Prior to Visit  Medication Sig Dispense Refill  . amLODipine (NORVASC) 10 MG tablet Take 0.5 tablets (5 mg total) by mouth daily.    [name]. aspirin EC 81 MG tablet Take 1 tablet (81 mg total) by mouth daily. 90 tablet 3  . atenolol (TENORMIN) 50 MG tablet Take 25 mg by mouth daily.     . fish oil-omega-3 fatty acids 1000 MG capsule Take 1 g by mouth daily.     . isosorbide mononitrate (IMDUR) 30 MG 24 hr tablet TAKE 1 TABLET BY MOUTH EVERY DAY 30 tablet 6  . latanoprost (XALATAN) 0.005 % ophthalmic solution Place 1 drop into both eyes Twice daily.    [name].  loperamide (IMODIUM A-D) 2 MG tablet Take 0.5 tablets (1 mg total) by mouth daily as needed for diarrhea or loose stools. 30 tablet 0  . meclizine (ANTIVERT) 25 MG tablet Take 1 tablet by mouth Twice daily as needed.    . nitroGLYCERIN (NITROSTAT) 0.4 MG SL tablet Place 1 tablet (0.4 mg total) under the tongue every 5 (five) minutes as needed. 25 tablet 3  . pantoprazole (PROTONIX) 40 MG tablet TAKE 1 TABLET (40 MG TOTAL) BY MOUTH DAILY. 30 tablet 11  . pravastatin (PRAVACHOL) 40 MG tablet Take 1 tablet (40 mg total) by mouth every evening. 30 tablet 6  . TRAVATAN Z 0.004 % SOLN ophthalmic solution Place 1 drop into both eyes At bedtime.    . Vitamin D, Ergocalciferol, (DRISDOL) 50000 UNITS CAPS capsule Take 50,000 Units by mouth every 7 (seven) days.     No current facility-administered medications on file prior to visit.       Objective:   Physical Exam Blood pressure 102/52, pulse 60, temperature 97.9 F (36.6 C), height 4\' 11"  (1.499 m), weight 159 lb (72.122 kg).  Alert and oriented. Skin warm and dry. Oral mucosa is moist.   . Sclera anicteric, conjunctivae is pink.  Thyroid not enlarged. No cervical lymphadenopathy. Lungs clear. Heart regular rate and rhythm.  Abdomen is soft. Bowel sounds are positive. No hepatomegaly. No abdominal masses felt. No tenderness.  No edema to lower extremities.         Assessment & Plan:  Diarrhea occasionally.  Take Imodium as needed. Fiber 4 gm daily or Benefiber daily. [facility] urgency at night. OV in 1 year.

## 2014-06-19 NOTE — Patient Instructions (Signed)
Imodium as needed. Fiber 4 gm  OV as needed.

## 2014-07-04 ENCOUNTER — Ambulatory Visit (INDEPENDENT_AMBULATORY_CARE_PROVIDER_SITE_OTHER): Payer: Medicare PPO | Admitting: Otolaryngology

## 2014-07-04 DIAGNOSIS — H903 Sensorineural hearing loss, bilateral: Secondary | ICD-10-CM

## 2014-11-11 ENCOUNTER — Other Ambulatory Visit: Payer: Self-pay | Admitting: *Deleted

## 2014-11-11 MED ORDER — ISOSORBIDE MONONITRATE ER 30 MG PO TB24: 30.0000 mg | ORAL_TABLET | Freq: Every day | ORAL | Status: DC

## 2014-12-03 ENCOUNTER — Ambulatory Visit: Payer: Medicare PPO | Admitting: Cardiology

## 2014-12-09 ENCOUNTER — Encounter: Payer: Self-pay | Admitting: Cardiology

## 2014-12-09 ENCOUNTER — Ambulatory Visit (INDEPENDENT_AMBULATORY_CARE_PROVIDER_SITE_OTHER): Payer: Medicare PPO | Admitting: Cardiology

## 2014-12-09 VITALS — BP 117/70 | HR 64 | Ht 59.0 in | Wt 159.2 lb

## 2014-12-09 DIAGNOSIS — I2510 Atherosclerotic heart disease of native coronary artery without angina pectoris: Secondary | ICD-10-CM

## 2014-12-09 DIAGNOSIS — Z23 Encounter for immunization: Secondary | ICD-10-CM | POA: Diagnosis not present

## 2014-12-09 DIAGNOSIS — I10 Essential (primary) hypertension: Secondary | ICD-10-CM

## 2014-12-09 NOTE — Progress Notes (Signed)
Cardiology Office Note  Date: 12/09/2014   ID: Belinda [name], DOB 1925/02/23, MRN [id number]  PCP: Kirstie Peri, MD  Primary Cardiologist: Nona Dell, MD   Chief Complaint  Patient presents with  . Coronary Artery Disease    History of Present Illness: Belinda [name] is an 81 y.o. female last seen in March. She is here with her daughter for a routine follow-up visit. Since I saw her she had an episode of severe bronchitis back in June requiring hospitalization. She has recuperated, now following with Dr. Orson Aloe for pulmonary evaluation in [place]. She uses oxygen intermittently during the day and consistently at night time.  Fortunately, she has been stable without progressing angina symptoms on current medical regimen. We reviewed her medications as outlined below.   Past Medical History  Diagnosis Date  . GERD (gastroesophageal reflux disease)   . Essential hypertension, benign   . Glaucoma   . Mixed hyperlipidemia   . Borderline diabetes   . Coronary atherosclerosis of native coronary artery     DES LAD and circumflex 1/09, LVEF 70%  . Overactive bladder     Current Outpatient Prescriptions  Medication Sig Dispense Refill  . albuterol (PROVENTIL) (2.5 MG/3ML) 0.083% nebulizer solution 2 (two) times daily.    [name] amLODipine (NORVASC) 10 MG tablet Take 10 mg by mouth daily.    [name] aspirin EC 81 MG tablet Take 1 tablet (81 mg total) by mouth daily. 90 tablet 3  . atenolol (TENORMIN) 50 MG tablet Take 25 mg by mouth daily.     . dorzolamide-timolol (COSOPT) 22.3-6.8 MG/ML ophthalmic solution Place 1 drop into both eyes 2 (two) times daily.    . fish oil-omega-3 fatty acids 1000 MG capsule Take 1 g by mouth daily.     . isosorbide mononitrate (IMDUR) 30 MG 24 hr tablet Take 1 tablet (30 mg total) by mouth daily. 30 tablet 6  . latanoprost (XALATAN) 0.005 % ophthalmic solution Place 1 drop into both eyes at bedtime.     [name] loperamide (IMODIUM A-D) 2 MG tablet Take 0.5  tablets (1 mg total) by mouth daily as needed for diarrhea or loose stools. 30 tablet 0  . meclizine (ANTIVERT) 25 MG tablet Take 1 tablet by mouth Twice daily as needed.    . nitroGLYCERIN (NITROSTAT) 0.4 MG SL tablet Place 1 tablet (0.4 mg total) under the tongue every 5 (five) minutes as needed. 25 tablet 3  . pantoprazole (PROTONIX) 40 MG tablet TAKE 1 TABLET (40 MG TOTAL) BY MOUTH DAILY. 30 tablet 11  . pravastatin (PRAVACHOL) 40 MG tablet Take 1 tablet (40 mg total) by mouth every evening. 30 tablet 6  . sertraline (ZOLOFT) 50 MG tablet Take 1 tablet by mouth daily.    . SYMBICORT 160-4.5 MCG/ACT inhaler Inhale 1 puff into the lungs 2 (two) times daily.    . Vitamin D, Ergocalciferol, (DRISDOL) 50000 UNITS CAPS capsule Take 50,000 Units by mouth every 7 (seven) days.     No current facility-administered medications for this visit.    Allergies:  Review of patient's allergies indicates no known allergies.   Social History: The patient  reports that she quit smoking about 68 years ago. Her smoking use included Cigarettes. She has a .2 pack-year smoking history. She has never used smokeless tobacco. She reports that she does not drink alcohol or use illicit drugs.   ROS:  Please see the history of present illness. Otherwise, complete review of systems is positive for chronic  shortness of breath.  All other systems are reviewed and negative.   Physical Exam: VS:  BP 117/70 mmHg  Pulse 64  Ht 4\' 11"  (1.499 m)  Wt 159 lb 3.2 oz (72.213 kg)  BMI 32.14 kg/m2  SpO2 95%, BMI Body mass index is 32.14 kg/(m^2).  Wt Readings from Last 3 Encounters:  12/09/14 159 lb 3.2 oz (72.213 kg)  06/19/14 159 lb (72.122 kg)  04/24/14 159 lb (72.122 kg)     No acute distress.  HEENT: Conjunctiva and lids normal, oropharynx clear.  Neck: Supple, no elevated jugular venous pressure, no bruits.  Lungs: Clear with diminished breath sounds, no wheezing, nonlabored.  Cardiac: Regular rate and rhythm,  soft systolic murmur, no S3 gallop.  Skin: Warm and dry.  Extremities: No significant pitting edema, distal pulses one plus.    ECG: Tracing from 04/24/2014 showed sinus rhythm with anteroseptal Q waves.  Other Studies Reviewed Today:  Echocardiogram 10/30/2012 [facility] reported LVEF 60-65% with diastolic dysfunction, Mac with trace mitral regurgitation, mildly sclerotic aortic valve, mild tricuspid regurgitation with normal RVSP, mildly dilated right ventricle.  Assessment and Plan:  1. Symptomatically stable CAD status post DES to the LAD and circumflex with moderate left main disease that has been managed medically. No changes were made today.  2. Essential hypertension, blood pressure is normal today.  Current medicines were reviewed with the patient today.  Disposition: FU with me in 6 months.   Signed, Jonelle SidleSamuel G. McDowell, MD, [facility] 12/09/2014 11:19 AM    [facility] [address], [place], [place] [address] Phone: [phone]) [phone]; Fax: [phone]) [phone]

## 2014-12-09 NOTE — Patient Instructions (Signed)
Your physician recommends that you continue on your current medications as directed. Please refer to the Current Medication list given to you today. Your physician recommends that you schedule a follow-up appointment in: 6 months. You will receive a reminder letter in the mail in about 4 months reminding you to call and schedule your appointment. If you don't receive this letter, please contact our office. 

## 2015-04-02 ENCOUNTER — Encounter (INDEPENDENT_AMBULATORY_CARE_PROVIDER_SITE_OTHER): Payer: Self-pay | Admitting: Internal Medicine

## 2015-06-05 ENCOUNTER — Encounter: Payer: Self-pay | Admitting: Cardiology

## 2015-06-05 ENCOUNTER — Ambulatory Visit (INDEPENDENT_AMBULATORY_CARE_PROVIDER_SITE_OTHER): Payer: Medicare Other | Admitting: Cardiology

## 2015-06-05 VITALS — BP 122/68 | HR 72 | Ht 59.0 in | Wt 160.8 lb

## 2015-06-05 DIAGNOSIS — I10 Essential (primary) hypertension: Secondary | ICD-10-CM | POA: Diagnosis not present

## 2015-06-05 DIAGNOSIS — I2510 Atherosclerotic heart disease of native coronary artery without angina pectoris: Secondary | ICD-10-CM | POA: Diagnosis not present

## 2015-06-05 NOTE — Progress Notes (Signed)
Cardiology Office Note  Date: 06/05/2015   ID: Belinda Lamerancy F Arrighi, DOB 04-10-1925, MRN [id number]  PCP: Kirstie PeriSHAH,ASHISH, MD  Primary Cardiologist: Nona DellSamuel McDowell, MD   Chief Complaint  Patient presents with  . Coronary Artery Disease    History of Present Illness: Belinda [name] is an 82 y.o. female last seen in October 2016. She presents for a routine follow-up visit with her daughter. Overall no major change in stamina since last encounter. She has still been reluctant to use as needed oxygen when she exerts herself, prescribed by pulmonologist. She does not report any angina symptoms or nitroglycerin use.  I reviewed her ECG today which shows sinus rhythm with decreased R wave progression.   Over her medications. Cardiac regimen includes aspirin, Norvasc, atenolol, Imdur, and Pravachol.  Past Medical History  Diagnosis Date  . GERD (gastroesophageal reflux disease)   . Essential hypertension, benign   . Glaucoma   . Mixed hyperlipidemia   . Borderline diabetes   . Coronary atherosclerosis of native coronary artery     DES LAD and circumflex 1/09, LVEF 70%  . Overactive bladder     Current Outpatient Prescriptions  Medication Sig Dispense Refill  . albuterol (PROVENTIL) (2.5 MG/3ML) 0.083% nebulizer solution 2 (two) times daily.    [name]. amLODipine (NORVASC) 10 MG tablet Take 10 mg by mouth daily.    [name]. atenolol (TENORMIN) 50 MG tablet Take 25 mg by mouth daily.     . dorzolamide-timolol (COSOPT) 22.3-6.8 MG/ML ophthalmic solution Place 1 drop into both eyes 2 (two) times daily.    . fish oil-omega-3 fatty acids 1000 MG capsule Take 1 g by mouth daily.     . isosorbide mononitrate (IMDUR) 30 MG 24 hr tablet Take 1 tablet (30 mg total) by mouth daily. 30 tablet 6  . latanoprost (XALATAN) 0.005 % ophthalmic solution Place 1 drop into both eyes at bedtime.     [name]. loperamide (IMODIUM A-D) 2 MG tablet Take 0.5 tablets (1 mg total) by mouth daily as needed for diarrhea or loose stools.  30 tablet 0  . meclizine (ANTIVERT) 25 MG tablet Take 1 tablet by mouth Twice daily as needed.    . nitroGLYCERIN (NITROSTAT) 0.4 MG SL tablet Place 1 tablet (0.4 mg total) under the tongue every 5 (five) minutes as needed. 25 tablet 3  . OXYGEN as needed (uses at home).    . pantoprazole (PROTONIX) 40 MG tablet TAKE 1 TABLET (40 MG TOTAL) BY MOUTH DAILY. 30 tablet 11  . pravastatin (PRAVACHOL) 20 MG tablet Take 10 mg by mouth daily.    . sertraline (ZOLOFT) 50 MG tablet Take 1 tablet by mouth daily.    . SYMBICORT 160-4.5 MCG/ACT inhaler Inhale 1 puff into the lungs 2 (two) times daily.    . Vitamin D, Ergocalciferol, (DRISDOL) 50000 UNITS CAPS capsule Take 50,000 Units by mouth every 7 (seven) days.    [name]. aspirin EC 81 MG tablet Take 1 tablet (81 mg total) by mouth daily. (Patient not taking: Reported on 06/05/2015) 90 tablet 3   No current facility-administered medications for this visit.   Allergies:  Review of patient's allergies indicates no known allergies.   Social History: The patient  reports that she quit smoking about 69 years ago. Her smoking use included Cigarettes. She has a .2 pack-year smoking history. She has never used smokeless tobacco. She reports that she does not drink alcohol or use illicit drugs.   ROS:  Please see the history  of present illness. Otherwise, complete review of systems is positive for chronic back pain.  All other systems are reviewed and negative.   Physical Exam: VS:  BP 122/68 mmHg  Pulse 72  Ht  (1.499 m)  Wt 160 lb 12.8 oz (72.938 kg)  BMI 32.46 kg/m2  SpO2 95%, BMI Body mass index is 32.46 kg/(m^2).  Wt Readings from Last 3 Encounters:  06/05/15 160 lb 12.8 oz (72.938 kg)  12/09/14 159 lb 3.2 oz (72.213 kg)  06/19/14 159 lb (72.122 kg)    No acute distress.  HEENT: Conjunctiva and lids normal, oropharynx clear.  Neck: Supple, no elevated jugular venous pressure, no bruits.  Lungs: Clear with diminished breath sounds, no wheezing,  nonlabored.  Cardiac: Regular rate and rhythm, soft systolic murmur, no S3 gallop.  Skin: Warm and dry.  Extremities: No significant pitting edema, distal pulses one plus.   ECG: Tracing from 04/24/2014 showed sinus rhythm with anteroseptal Q waves.  Other Studies Reviewed Today:  Echocardiogram 10/30/2012 [facility]) reported LVEF 60-65% with diastolic dysfunction, Mac with trace mitral regurgitation, mildly sclerotic aortic valve, mild tricuspid regurgitation with normal RVSP, mildly dilated right ventricle.  Assessment and Plan:  1. Symptomatically stable CAD status post DES to the LAD and circumflex in 2009. She continues to do well on medical therapy with overall conservative management. No changes made in current regimen.  2. Essential hypertension, blood pressure is adequately controlled today.  Current medicines were reviewed with the patient today.   Orders Placed This Encounter  Procedures  . EKG 12-Lead    Disposition: FU with me in 6 months.   Signed, [name], MD, [facility] 06/05/2015 4:25 PM    [facility] at [facility] [address] [place], [place], [place] [address] Phone: [phone]; Fax: [phone]

## 2015-06-05 NOTE — Patient Instructions (Signed)
Your physician wants you to follow-up in: 6 months with Dr. McDowell You will receive a reminder letter in the mail two months in advance. If you don't receive a letter, please call our office to schedule the follow-up appointment.  Your physician recommends that you continue on your current medications as directed. Please refer to the Current Medication list given to you today.  Thank you for choosing [facility]!!    

## 2015-06-16 ENCOUNTER — Other Ambulatory Visit (INDEPENDENT_AMBULATORY_CARE_PROVIDER_SITE_OTHER): Payer: Self-pay | Admitting: Internal Medicine

## 2015-06-16 ENCOUNTER — Other Ambulatory Visit: Payer: Self-pay | Admitting: Cardiology

## 2015-06-24 ENCOUNTER — Ambulatory Visit (INDEPENDENT_AMBULATORY_CARE_PROVIDER_SITE_OTHER): Payer: Medicare PPO | Admitting: Internal Medicine

## 2015-06-25 ENCOUNTER — Ambulatory Visit (INDEPENDENT_AMBULATORY_CARE_PROVIDER_SITE_OTHER): Payer: Medicare Other | Admitting: Internal Medicine

## 2015-06-26 ENCOUNTER — Ambulatory Visit (INDEPENDENT_AMBULATORY_CARE_PROVIDER_SITE_OTHER): Payer: Medicare Other | Admitting: Otolaryngology

## 2015-06-26 DIAGNOSIS — H903 Sensorineural hearing loss, bilateral: Secondary | ICD-10-CM | POA: Diagnosis not present

## 2016-01-12 ENCOUNTER — Other Ambulatory Visit: Payer: Self-pay | Admitting: Cardiology

## 2016-02-10 ENCOUNTER — Other Ambulatory Visit: Payer: Self-pay | Admitting: Cardiology

## 2016-03-17 ENCOUNTER — Telehealth: Payer: Self-pay | Admitting: Cardiology

## 2016-03-17 MED ORDER — ISOSORBIDE MONONITRATE ER 30 MG PO TB24: 30.0000 mg | ORAL_TABLET | Freq: Every day | ORAL | 1 refills | Status: AC

## 2016-03-17 NOTE — Telephone Encounter (Signed)
Patient called stating that she needs to have the isosorbide mononitrate (IMDUR) 30 MG 24   Filled before Friday due to [name] packages her medications and everything has to be packaged before 12 noon.  Please call Orvan FalconerJan Steckman [phone].

## 2016-03-19 ENCOUNTER — Ambulatory Visit: Payer: Medicare Other | Admitting: Cardiology

## 2016-04-19 ENCOUNTER — Encounter: Payer: Self-pay | Admitting: *Deleted

## 2016-04-20 ENCOUNTER — Encounter: Payer: Self-pay | Admitting: Cardiology

## 2016-04-20 ENCOUNTER — Ambulatory Visit (INDEPENDENT_AMBULATORY_CARE_PROVIDER_SITE_OTHER): Payer: Medicare Other | Admitting: Cardiology

## 2016-04-20 VITALS — BP 115/65 | HR 68 | Ht 59.0 in | Wt 156.8 lb

## 2016-04-20 DIAGNOSIS — I10 Essential (primary) hypertension: Secondary | ICD-10-CM | POA: Diagnosis not present

## 2016-04-20 DIAGNOSIS — I2510 Atherosclerotic heart disease of native coronary artery without angina pectoris: Secondary | ICD-10-CM | POA: Diagnosis not present

## 2016-04-20 MED ORDER — AMLODIPINE BESYLATE 5 MG PO TABS: 5.0000 mg | ORAL_TABLET | Freq: Every day | ORAL | 3 refills | Status: DC

## 2016-04-20 NOTE — Progress Notes (Signed)
Cardiology Office Note  Date: 04/20/2016   ID: [name], DOB 06-25-1925, MRN [id number]  PCP: Monico Blitz, MD  Primary Cardiologist: Rozann Lesches, MD   Chief Complaint  Patient presents with  . Coronary Artery Disease    History of Present Illness: Belinda [name] is an 83 y.o. female last seen in April 2017. She is here today with her daughter for a follow-up visit. Reports no progressive angina symptoms. Uses supplemental oxygen intermittently at home, and it does seem to help her shortness of breath.  Her daughter states that there have been some times when [name]' blood pressure has been low and she has been lightheaded. We reviewed her medications and discussed cutting her Norvasc dose down.   I personally reviewed her ECG today which shows sinus rhythm with possible old anteroseptal infarct pattern.  Past Medical History:  Diagnosis Date  . Borderline diabetes   . Coronary atherosclerosis of native coronary artery    DES LAD and circumflex 1/09, LVEF 70%  . Essential hypertension, benign   . GERD (gastroesophageal reflux disease)   . Glaucoma   . Mixed hyperlipidemia   . Overactive bladder     Past Surgical History:  Procedure Laterality Date  . APPENDECTOMY    . BACK SURGERY    . CHOLECYSTECTOMY    . COLONOSCOPY  07/15/2011   Procedure: COLONOSCOPY;  Surgeon: Rogene Houston, MD;  [facility]: [facility];  Service: Endoscopy;  Laterality: N/A;  930  . HERNIA REPAIR    . TONSILLECTOMY      Current Outpatient Prescriptions  Medication Sig Dispense Refill  . albuterol (PROVENTIL) (2.5 MG/3ML) 0.083% nebulizer solution 2 (two) times daily.    [name] amLODipine (NORVASC) 10 MG tablet Take 10 mg by mouth daily.    [name] aspirin EC 81 MG tablet Take 1 tablet (81 mg total) by mouth daily. 90 tablet 3  . atenolol (TENORMIN) 50 MG tablet Take 25 mg by mouth daily.     . dorzolamide-timolol (COSOPT) 22.3-6.8 MG/ML ophthalmic solution Place 1 drop into both eyes 2  (two) times daily.    . fish oil-omega-3 fatty acids 1000 MG capsule Take 1 g by mouth daily.     . isosorbide mononitrate (IMDUR) 30 MG 24 hr tablet Take 1 tablet (30 mg total) by mouth daily. 30 tablet 1  . latanoprost (XALATAN) 0.005 % ophthalmic solution Place 1 drop into both eyes at bedtime.     [name] loperamide (IMODIUM A-D) 2 MG tablet Take 0.5 tablets (1 mg total) by mouth daily as needed for diarrhea or loose stools. 30 tablet 0  . meclizine (ANTIVERT) 25 MG tablet Take 1 tablet by mouth Twice daily as needed.    . nitroGLYCERIN (NITROSTAT) 0.4 MG SL tablet Place 1 tablet (0.4 mg total) under the tongue every 5 (five) minutes as needed. 25 tablet 3  . OXYGEN as needed (uses at home).    . pantoprazole (PROTONIX) 40 MG tablet TAKE 1 TABLET BY MOUTH EVERY DAY 30 tablet 11  . pravastatin (PRAVACHOL) 20 MG tablet Take 10 mg by mouth daily.    . sertraline (ZOLOFT) 50 MG tablet Take 1 tablet by mouth daily.    . SYMBICORT 160-4.5 MCG/ACT inhaler Inhale 1 puff into the lungs 2 (two) times daily.    . Vitamin D, Ergocalciferol, (DRISDOL) 50000 UNITS CAPS capsule Take 50,000 Units by mouth every 7 (seven) days.     No current facility-administered medications for this visit.  Allergies:  Patient has no known allergies.   Social History: The patient  reports that she quit smoking about 70 years ago. Her smoking use included Cigarettes. She has a 0.20 pack-year smoking history. She has never used smokeless tobacco. She reports that she does not drink alcohol or use drugs.   ROS:  Please see the history of present illness. Otherwise, complete review of systems is positive for hearing loss.  All other systems are reviewed and negative.   Physical Exam: VS:  BP 115/65   Pulse 68   Ht _0  (1.499 m)   Wt 156 lb 12.8 oz (71.1 kg)   SpO2 95%   BMI 31.67 kg/m , BMI Body mass index is 31.67 kg/m.  Wt Readings from Last 3 Encounters:  04/20/16 156 lb 12.8 oz (71.1 kg)  06/05/15 160 lb 12.8  oz (72.9 kg)  12/09/14 159 lb 3.2 oz (72.2 kg)    Elderly woman, appears comfortable. HEENT: Conjunctiva and lids normal, oropharynx clear.  Neck: Supple, no elevated jugular venous pressure, no bruits.  Lungs: Clear with diminished breath sounds, no wheezing, nonlabored.  Cardiac: Regular rate and rhythm, soft systolic murmur, no S3 gallop.  Skin: Warm and dry.  Extremities: No significant pitting edema, distal pulses 1+.   ECG: I personally reviewed the tracing from 06/05/2015 which showed sinus rhythm with decreased anteroseptal R-wave progression.  Other Studies Reviewed Today:  Echocardiogram 10/30/2012 [facility]): LVEF 51-76% with diastolic dysfunction, Mac with trace mitral regurgitation, mildly sclerotic aortic valve, mild tricuspid regurgitation with normal RVSP, mildly dilated right ventricle.  Assessment and Plan:  1. Symptomatic stable CAD status post DES to the LAD and circumflex in 2009. Continue with conservative management in the absence of accelerating angina. No changes made to current regimen with the exception of reducing Norvasc to 5 mg daily to avoid symptomatic hypotension.  2. Hyperlipidemia, continues on Pravachol. She follows with Dr. Manuella Ghazi.  Current medicines were reviewed with the patient today.   Orders Placed This Encounter  Procedures  . EKG 12-Lead    Disposition: Follow-up in 6 months.  Signed, Satira Sark, MD, [facility] 04/20/2016 2:39 PM    [facility] at [place], [place], [place] [address] Phone: [phone]; Fax: [phone]

## 2016-04-20 NOTE — Patient Instructions (Signed)
   Your physician has recommended you make the following change in your medication: Decrease Norvasc to 5mg  daily. You can break the 10mg  dose in half until finished.    Your physician wants you to follow-up in: 6 months. You will receive a reminder letter in the mail 3 months in advance. If you don't receive a letter, please call our office to schedule the follow-up appointment.

## 2016-06-18 ENCOUNTER — Other Ambulatory Visit (INDEPENDENT_AMBULATORY_CARE_PROVIDER_SITE_OTHER): Payer: Self-pay | Admitting: Internal Medicine

## 2016-08-26 ENCOUNTER — Ambulatory Visit (INDEPENDENT_AMBULATORY_CARE_PROVIDER_SITE_OTHER): Payer: Medicare Other | Admitting: Otolaryngology

## 2016-08-26 DIAGNOSIS — H903 Sensorineural hearing loss, bilateral: Secondary | ICD-10-CM | POA: Diagnosis not present

## 2016-08-26 DIAGNOSIS — H6121 Impacted cerumen, right ear: Secondary | ICD-10-CM | POA: Diagnosis not present

## 2016-12-01 ENCOUNTER — Other Ambulatory Visit (INDEPENDENT_AMBULATORY_CARE_PROVIDER_SITE_OTHER): Payer: Self-pay | Admitting: Internal Medicine

## 2016-12-09 NOTE — Progress Notes (Signed)
Cardiology Office Note  Date: 12/10/2016   ID: Belinda [name], DOB [birth year], MRN [id number]  PCP: Monico Blitz, MD  Primary Cardiologist: Rozann Lesches, MD   Chief Complaint  Patient presents with  . Coronary Artery Disease    History of Present Illness: Belinda [name] is an 83 y.o. female last seen in March.  She is here today with her daughter for follow-up.  She has a lot of help from family members.  No longer driving, somewhat frustrated about this.  She does not report any angina symptoms, has chronic dyspnea on exertion.  At the last visit we reduced Norvasc dose.  She still has intermittent lightheadedness.  They state that blood pressure at home has not been too low, systolics generally 494W.  Today's blood pressure was lower.  Current cardiac regimen includes aspirin, Norvasc, atenolol, Imdur, Pravachol, and as needed nitroglycerin.  Past Medical History:  Diagnosis Date  . Borderline diabetes   . Coronary atherosclerosis of native coronary artery    DES LAD and circumflex 1/09, LVEF 70%  . Essential hypertension, benign   . GERD (gastroesophageal reflux disease)   . Glaucoma   . Mixed hyperlipidemia   . Overactive bladder     Past Surgical History:  Procedure Laterality Date  . APPENDECTOMY    . BACK SURGERY    . CHOLECYSTECTOMY    . COLONOSCOPY  07/15/2011   Procedure: COLONOSCOPY;  Surgeon: Rogene Houston, MD;  [facility]: [facility];  Service: Endoscopy;  Laterality: N/A;  930  . HERNIA REPAIR    . TONSILLECTOMY      Current Outpatient Prescriptions  Medication Sig Dispense Refill  . albuterol (PROVENTIL) (2.5 MG/3ML) 0.083% nebulizer solution 2 (two) times daily.    [name] amLODipine (NORVASC) 5 MG tablet Take 1 tablet (5 mg total) by mouth daily. 90 tablet 3  . aspirin EC 81 MG tablet Take 1 tablet (81 mg total) by mouth daily. 90 tablet 3  . atenolol (TENORMIN) 50 MG tablet Take 25 mg by mouth daily.     . dorzolamide-timolol (COSOPT) 22.3-6.8  MG/ML ophthalmic solution Place 1 drop into both eyes 2 (two) times daily.    . fish oil-omega-3 fatty acids 1000 MG capsule Take 1 g by mouth daily.     . isosorbide mononitrate (IMDUR) 30 MG 24 hr tablet Take 1 tablet (30 mg total) by mouth daily. 30 tablet 1  . latanoprost (XALATAN) 0.005 % ophthalmic solution Place 1 drop into both eyes at bedtime.     [name] loperamide (IMODIUM A-D) 2 MG tablet Take 0.5 tablets (1 mg total) by mouth daily as needed for diarrhea or loose stools. 30 tablet 0  . meclizine (ANTIVERT) 25 MG tablet Take 1 tablet by mouth Twice daily as needed.    . nitroGLYCERIN (NITROSTAT) 0.4 MG SL tablet Place 1 tablet (0.4 mg total) under the tongue every 5 (five) minutes as needed. 25 tablet 3  . OXYGEN as needed (uses at home).    . pantoprazole (PROTONIX) 40 MG tablet TAKE ONE TABLET BY MOUTH EVERY DAY 30 tablet 3  . pravastatin (PRAVACHOL) 20 MG tablet Take 10 mg by mouth daily.    . sertraline (ZOLOFT) 50 MG tablet Take 1 tablet by mouth daily.    . SYMBICORT 160-4.5 MCG/ACT inhaler Inhale 1 puff into the lungs 2 (two) times daily.    . Vitamin D, Ergocalciferol, (DRISDOL) 50000 UNITS CAPS capsule Take 50,000 Units by mouth every 7 (seven) days.  No current facility-administered medications for this visit.    Allergies:  Patient has no known allergies.   Social History: The patient  reports that she quit smoking about 70 years ago. Her smoking use included Cigarettes. She has a 0.20 pack-year smoking history. She has never used smokeless tobacco. She reports that she does not drink alcohol or use drugs.   ROS:  Please see the history of present illness. Otherwise, complete review of systems is positive for intermittent cough.  All other systems are reviewed and negative.   Physical Exam: VS:  BP (!) 92/50   Pulse 76   Ht _0  (1.499 m)   Wt 159 lb 12.8 oz (72.5 kg)   SpO2 92%   BMI 32.28 kg/m , BMI Body mass index is 32.28 kg/m.  Wt Readings from Last 3  Encounters:  12/10/16 159 lb 12.8 oz (72.5 kg)  04/20/16 156 lb 12.8 oz (71.1 kg)  06/05/15 160 lb 12.8 oz (72.9 kg)    General: Elderly woman, appears comfortable at rest. HEENT: Conjunctiva and lids normal, oropharynx clear. Neck: Supple, no elevated JVP or carotid bruits, no thyromegaly. Lungs: No wheezing, nonlabored breathing at rest. Cardiac: Regular rate and rhythm, no S3, 2/6 systolic murmur, no pericardial rub. Abdomen: Soft, nontender, bowel sounds present, no guarding or rebound. Extremities: No pitting edema, distal pulses 1-2+. Skin: Warm and dry.  ECG: I personally reviewed the tracing from 04/20/2016 which showed sinus rhythm with old anteroseptal infarct pattern.  Other Studies Reviewed Today:  Echocardiogram 10/30/2012 [facility]): LVEF 59-29% with diastolic dysfunction, Mac with trace mitral regurgitation, mildly sclerotic aortic valve, mild tricuspid regurgitation with normal RVSP, mildly dilated right ventricle.  Assessment and Plan:  1.  CAD status post DES to the LAD and circumflex in 2009.  We will continue with medical therapy and conservative follow-up in the absence of progressive symptoms and at her advanced age.  2.  History of hypertension.  Blood pressure low today.  I have asked that they keep an eye on blood pressure checks at home, we may need to reduce Norvasc further to 2-1/2 mg daily.  3.  Hyperlipidemia, remains on Pravachol with follow-up per Dr. Manuella Ghazi.  Current medicines were reviewed with the patient today.  Disposition: Follow-up in 6 months.  Signed, Satira Sark, MD, [facility] 12/10/2016 12:07 PM    [facility] at [place], [place], [place] [address] Phone: [phone]; Fax: [phone]

## 2016-12-10 ENCOUNTER — Ambulatory Visit (INDEPENDENT_AMBULATORY_CARE_PROVIDER_SITE_OTHER): Payer: Medicare Other | Admitting: Cardiology

## 2016-12-10 ENCOUNTER — Encounter: Payer: Self-pay | Admitting: Cardiology

## 2016-12-10 VITALS — BP 92/50 | HR 76 | Ht 59.0 in | Wt 159.8 lb

## 2016-12-10 DIAGNOSIS — E782 Mixed hyperlipidemia: Secondary | ICD-10-CM | POA: Diagnosis not present

## 2016-12-10 DIAGNOSIS — I2510 Atherosclerotic heart disease of native coronary artery without angina pectoris: Secondary | ICD-10-CM

## 2016-12-10 DIAGNOSIS — I10 Essential (primary) hypertension: Secondary | ICD-10-CM | POA: Diagnosis not present

## 2016-12-10 NOTE — Patient Instructions (Signed)
Medication Instructions:   Your physician recommends that you continue on your current medications as directed. Please refer to the Current Medication list given to you today.  Labwork:  NONE  Testing/Procedures:  NONE  Follow-Up:  Your physician recommends that you schedule a follow-up appointment in: 6 months. You will receive a reminder letter in the mail in about 4 months reminding you to call and schedule your appointment. If you don't receive this letter, please contact our office.  Any Other Special Instructions Will Be Listed Below (If Applicable). Your physician has requested that you regularly monitor and record your blood pressure readings at home. Please use the same machine at the same time of day to check your readings and record them to bring to your follow-up visit. Contact our office if your home blood pressure readings are staying too low.   If you need a refill on your cardiac medications before your next appointment, please call your pharmacy.

## 2017-04-03 ENCOUNTER — Other Ambulatory Visit (INDEPENDENT_AMBULATORY_CARE_PROVIDER_SITE_OTHER): Payer: Self-pay | Admitting: Internal Medicine

## 2017-05-02 ENCOUNTER — Other Ambulatory Visit: Payer: Self-pay | Admitting: Cardiology

## 2017-07-27 ENCOUNTER — Ambulatory Visit: Payer: Medicare Other | Admitting: Internal Medicine

## 2017-07-27 ENCOUNTER — Other Ambulatory Visit (INDEPENDENT_AMBULATORY_CARE_PROVIDER_SITE_OTHER): Payer: Medicare Other

## 2017-07-27 ENCOUNTER — Encounter: Payer: Self-pay | Admitting: Internal Medicine

## 2017-07-27 VITALS — BP 120/68 | HR 70 | Ht 59.0 in | Wt 153.0 lb

## 2017-07-27 DIAGNOSIS — R0609 Other forms of dyspnea: Secondary | ICD-10-CM | POA: Diagnosis not present

## 2017-07-27 DIAGNOSIS — J441 Chronic obstructive pulmonary disease with (acute) exacerbation: Secondary | ICD-10-CM

## 2017-07-27 DIAGNOSIS — J45901 Unspecified asthma with (acute) exacerbation: Secondary | ICD-10-CM | POA: Diagnosis not present

## 2017-07-27 DIAGNOSIS — I10 Essential (primary) hypertension: Secondary | ICD-10-CM

## 2017-07-27 LAB — BASIC METABOLIC PANEL
BUN: 18 mg/dL (ref 6–23)
CALCIUM: 9.5 mg/dL (ref 8.4–10.5)
CO2: 32 mEq/L (ref 19–32)
Chloride: 104 mEq/L (ref 96–112)
Creatinine, Ser: 1.08 mg/dL (ref 0.40–1.20)
GFR: 50.68 mL/min — AB (ref >60.00)
GLUCOSE: 100 mg/dL — AB (ref 70–99)
Potassium: 4.8 mEq/L (ref 3.5–5.1)
SODIUM: 141 meq/L (ref 135–145)

## 2017-07-27 LAB — CBC WITH DIFFERENTIAL/PLATELET
BASOS PCT: 0.9 % (ref 0.0–3.0)
Basophils Absolute: 0.1 10*3/uL (ref 0.0–0.1)
EOS ABS: 0.1 10*3/uL (ref 0.0–0.7)
Eosinophils Relative: 0.8 % (ref 0.0–5.0)
HEMATOCRIT: 40.4 % (ref 36.0–46.0)
Hemoglobin: 13.5 g/dL (ref 12.0–15.0)
LYMPHS ABS: 1.8 10*3/uL (ref 0.7–4.0)
LYMPHS PCT: 20.1 % (ref 12.0–46.0)
MCHC: 33.3 g/dL (ref 30.0–36.0)
MCV: 95.6 fl (ref 78.0–100.0)
MONO ABS: 0.9 10*3/uL (ref 0.1–1.0)
Monocytes Relative: 10 % (ref 3.0–12.0)
NEUTROS ABS: 6.3 10*3/uL (ref 1.4–7.7)
NEUTROS PCT: 68.2 % (ref 43.0–77.0)
PLATELETS: 168 10*3/uL (ref 150.0–400.0)
RBC: 4.22 Mil/uL (ref 3.87–5.11)
RDW: 14.8 % (ref 11.5–15.5)
WBC: 9.2 10*3/uL (ref 4.0–10.5)

## 2017-07-27 LAB — TSH: TSH: 4.27 u[IU]/mL (ref 0.35–4.50)

## 2017-07-27 LAB — BRAIN NATRIURETIC PEPTIDE: Pro B Natriuretic peptide (BNP): 56 pg/mL (ref 0.0–100.0)

## 2017-07-27 MED ORDER — BISOPROLOL FUMARATE 5 MG PO TABS: 5.0000 mg | ORAL_TABLET | Freq: Every day | ORAL | 11 refills | Status: DC

## 2017-07-27 MED ORDER — PANTOPRAZOLE SODIUM 40 MG PO TBEC: DELAYED_RELEASE_TABLET | ORAL | 2 refills | Status: DC

## 2017-07-27 MED ORDER — BUDESONIDE 0.25 MG/2ML IN SUSP: RESPIRATORY_TRACT | 12 refills | Status: DC

## 2017-07-27 MED ORDER — ARFORMOTEROL TARTRATE 15 MCG/2ML IN NEBU: 15.0000 ug | INHALATION_SOLUTION | Freq: Two times a day (BID) | RESPIRATORY_TRACT | 0 refills | Status: DC

## 2017-07-27 NOTE — Assessment & Plan Note (Signed)
Changed tenormin to bisoprolol 5 mg daily due to ? Refractory asthma?    In the setting of respiratory symptoms of unknown etiology,  It would be preferable to use bystolic, the most beta -1  selective Beta blocker available in sample form, with bisoprolol the most selective generic choice  on the market, at least on a trial basis, to make sure the spillover Beta 2 effects of the less specific Beta blockers are not contributing to this patient's symptoms.   Try bisoprolol 5 mg daily and encouraged trial off timolol eye drops same reason but defer latter to opth

## 2017-07-27 NOTE — Progress Notes (Signed)
Subjective:     Patient ID: Belinda [name], female   DOB: [birth year],   MRN: [id number]  HPI  84 yowf  Quit smoking age 84 with seasonal symptoms of coughing sneezing worse since onset breathing problem around 2009  > cardiac eval :  DES to LAD and Cx 2009 with preserved LV fxn as of 2014.    07/27/2017 1st [facility] office visit/ Belinda [name]   Chief Complaint  Patient presents with  . Pulmonary Consult    Referred by Dr. Sherril Croon for eval of COPD.  Pt c/o SOB for the past 5-6 yrs, worse for the past 2 years. She states that she gets SOB "when I move alot".  She has had cough for at least the past month- prod with yellow to clear sputum. She states she often gets "choked up"  wearing 02 hs x sev years and sleeps ok on one pillow but p stirring  Every day x years now starts with coughing/choking worse with eating also and sob with minimal activity. Did not improve on symbicort x  Several year Dr Juanetta Gosling changed to trelegy x 6 monts prior to OV   [facility] and Orson Aloe do not feel she has copd per Daughter     NO  obvious day to day or daytime variability or assoc truly  excess/ purulent sputum or mucus plugs or hemoptysis or cp or chest tightness, subjective wheeze or overt sinus or hb symptoms. No unusual exposure hx or h/o childhood pna/ asthma or knowledge of premature birth.  Sleeping ok flat  On 2.5 lpm without nocturnal  or early am exacerbation  of respiratory  c/o's or need for noct saba. Also denies any obvious fluctuation of symptoms with weather or environmental changes or other aggravating or alleviating factors except as outlined above   Current Allergies, Complete Past Medical History, Past Surgical History, Family History, and Social History were reviewed in [employer] record.  ROS  The following are not active complaints unless bolded Hoarseness, sore throat, dysphagia, dental problems, itching, sneezing,  nasal congestion or discharge of excess mucus or  purulent secretions, ear ache,   fever, chills, sweats, unintended wt loss or wt gain, classically pleuritic or exertional cp,  orthopnea pnd or leg swelling intermittent now now, presyncope, palpitations, abdominal pain, anorexia, nausea, vomiting, diarrhea  or change in bowel habits or change in bladder habits, change in stools or change in urine, dysuria, hematuria,  rash, arthralgias, visual complaints, headache, numbness, weakness or ataxia or problems with walking or coordination,  change in mood/affect or memory.        Current Meds  Medication Sig  . albuterol (PROVENTIL) (2.5 MG/3ML) 0.083% nebulizer solution 2 (two) times daily.  [name] amLODipine (NORVASC) 5 MG tablet TAKE ONE TABLET BY MOUTH EVERY DAY  . aspirin EC 81 MG tablet Take 1 tablet (81 mg total) by mouth daily.  [name] atenolol (TENORMIN) 50 MG tablet Take 25 mg by mouth daily.   . dorzolamide-timolol (COSOPT) 22.3-6.8 MG/ML ophthalmic solution Place 1 drop into both eyes 2 (two) times daily.  . fish oil-omega-3 fatty acids 1000 MG capsule Take 1 g by mouth daily.   . Fluticasone-Umeclidin-Vilant (TRELEGY ELLIPTA) 100-62.5-25 MCG/INH AEPB Inhale 1 puff into the lungs daily.  [name] HYDROcodone-homatropine (HYCODAN) 5-1.5 MG/5ML syrup 5 ml every 6 hours as needed  . isosorbide mononitrate (IMDUR) 30 MG 24 hr tablet Take 1 tablet (30 mg total) by mouth daily.  [name] levothyroxine (SYNTHROID, LEVOTHROID) 25 MCG tablet  Take 25 mcg by mouth daily before breakfast.  . meclizine (ANTIVERT) 25 MG tablet Take 1 tablet by mouth Twice daily as needed.  . OXYGEN 2lpm with sleep and as needed during the day DME- [facility]  . pantoprazole (PROTONIX) 40 MG tablet TAKE ONE TABLET BY MOUTH EVERY DAY  . pravastatin (PRAVACHOL) 20 MG tablet Take 10 mg by mouth daily.  . sertraline (ZOLOFT) 50 MG tablet Take 1 tablet by mouth daily.  . Vitamin D, Ergocalciferol, (DRISDOL) 50000 UNITS CAPS capsule Take 50,000 Units by mouth every 7 (seven) days.  . [DISCONTINUED]  aspirin EC 81 MG tablet Take 1 tablet by mouth daily.         Review of Systems     Objective:   Physical Exam    frail elderly very hoarse  wf with freq throat clearing     Wt Readings from Last 3 Encounters:  07/27/17 153 lb (69.4 kg)  12/10/16 159 lb 12.8 oz (72.5 kg)  04/20/16 156 lb 12.8 oz (71.1 kg)     Vital signs reviewed - Note on arrival 02 sats  96% on RA  HEENT: nl   turbinates bilaterally, and oropharynx. Nl external ear canals without cough reflex - full dentures   NECK :  without JVD/Nodes/TM/ nl carotid upstrokes bilaterally   LUNGS: no acc muscle use,  Nl contour chest which is clear to A and P bilaterally without cough on insp or exp maneuvers   CV:  RRR  no s3 or murmur or increase in P2, and no edema   ABD:  soft and nontender with nl inspiratory excursion in the supine position. No bruits or organomegaly appreciated, bowel sounds nl  MS: slow  gait/ ext warm without deformities, calf tenderness, cyanosis or clubbing No obvious joint restrictions   SKIN: warm and dry without lesions    NEURO:  alert, approp, nl sensorium with  no motor or cerebellar deficits apparent.           I personally reviewed images and agree with radiology impression as follows:  CXR:   06/29/17  Cm/ bilateral 11th rib fx    Labs ordered/ reviewed:      Chemistry      Component Value Date/Time   NA 141 07/27/2017 1230   K 4.8 07/27/2017 1230   CL 104 07/27/2017 1230   CO2 32 07/27/2017 1230   BUN 18 07/27/2017 1230   CREATININE 1.08 07/27/2017 1230      Component Value Date/Time   CALCIUM 9.5 07/27/2017 1230   ALKPHOS 72 02/21/2007 0633   AST 22 02/21/2007 0633   ALT 18 02/21/2007 0633   BILITOT 0.5 02/21/2007 0633        Lab Results  Component Value Date   WBC 9.2 07/27/2017   HGB 13.5 07/27/2017   HCT 40.4 07/27/2017   MCV 95.6 07/27/2017   PLT 168.0 07/27/2017       EOS                                                               0.1  07/27/2017       Lab Results  Component Value Date   TSH 4.27 07/27/2017     Lab Results  Component Value Date   PROBNP 56.0 07/27/2017         Labs ordered 07/27/2017  Allergy profile     Assessment:

## 2017-07-27 NOTE — Assessment & Plan Note (Signed)
Try off trelegy and on brovana/bud for now

## 2017-07-27 NOTE — Assessment & Plan Note (Addendum)
This is clearly not classic copd and Symptoms are markedly disproportionate to objective findings and not clear to what extent this is actually a pulmonary  problem but pt does appear to have difficult to sort out respiratory symptoms of unknown origin for which  DDX  = almost all start with A and  include Adherence, Ace Inhibitors, Acid Reflux, Active Sinus Disease, Alpha 1 Antitripsin deficiency, Anxiety masquerading as Airways dz,  ABPA,  Allergy(esp in young), Aspiration (esp in elderly), Adverse effects of meds,  Active smokers, A bunch of PE's/clot burden (a few small clots can't cause this syndrome unless there is already severe underlying pulm or vascular dz with poor reserve),  Anemia or thyroid disorder, plus two Bs  = Bronchiectasis and Beta blocker use..and one C= CHF     Adherence is always the initial "prime suspect" and is a multilayered concern that requires a "trust but verify" approach in every patient - starting with knowing how to use medications, especially inhalers, correctly, keeping up with refills and understanding the fundamental difference between maintenance and prns vs those medications only taken for a very short course and then stopped and not refilled.  - return with all meds in hand using a trust but verify approach to confirm accurate Medication  Reconciliation The principal here is that until we are certain that the  patients are doing what we've asked, it makes no sense to ask them to do more.   To keep things simple, I have asked the patient to first separate medicines that are perceived as maintenance, that is to be taken daily "no matter what", from those medicines that are taken on only on an as-needed basis and I have given the patient examples of both, and then return to see our NP to generate a  detailed  medication calendar which should be followed until the next physician sees the patient and updates it.    ? Acid (or non-acid) GERD > always difficult to exclude as  up to 75% of pts in some series report no assoc GI/ Heartburn symptoms> rec max (24h)  acid suppression and diet restrictions/ reviewed and instructions given in writing.   ? Allergy/asthma > change to brovana/bud trial basis x 2 weeks   ? Adverse effects from dpi > d/c trelegy  ? Anxiety/depression/deconditioning>>  usually at the bottom of this list of usual suspects but should be much higher on this pt's based on H and P and note already on psychotropics and may interfere with adherence and also interpretation of response or lack thereof to symptom management which can be quite subjective.   ? Anemia/ thyroid dz > ruled out today  ? Beta blocker effects > try off tenormin (see hbp) and consider off timolol eyedrops as well   ? CHF > bnp excludes this dx for all intents and purposes   Total time devoted to counseling  > 50 % of initial 60 min office visit:  review case with pt/daughter discussion of options/alternatives/ personally creating written customized instructions  in presence of pt  then going over those specific  Instructions directly with the pt including how to use all of the meds but in particular covering each new medication in detail and the difference between the maintenance= "automatic" meds and the prns using an action plan format for the latter (If this problem/symptom => do that organization reading Left to right).  Please see AVS from this visit for a full list of these instructions which I personally  wrote for this pt and  are unique to this visit.

## 2017-07-27 NOTE — Patient Instructions (Addendum)
I strongly recommend your eye doctor consider an alternative to your timolol eyedrops as they can cause cough and wheeze  Stop atenolol and replace bisoprolol 5 mg one daily (bag #1)   Stop trelegy and fish oil   Change pantoprazole to 40 mg Take 30- 60 min before your first and last meals of the day   GERD (REFLUX)  is an extremely common cause of respiratory symptoms just like yours , many times with no obvious heartburn at all.    It can be treated with medication, but also with lifestyle changes including elevation of the head of your bed (ideally with 6 inch  bed blocks),  Smoking cessation, avoidance of late meals, excessive alcohol, and avoid fatty foods, chocolate, peppermint, colas, red wine, and acidic juices such as orange juice.  NO MINT OR MENTHOL PRODUCTS SO NO COUGH DROPS   USE SUGARLESS CANDY INSTEAD (Jolley ranchers or Stover's or Life Savers) or even ice chips will also do - the key is to swallow to prevent all throat clearing. NO OIL BASED VITAMINS - use powdered substitutes.    Plan A = Automatic = Brovana 15 mcg twice daily along budesonide 0.25 mg (Bag #1)    Plan B = Backup (Bag #2)  Only use your albuterol as a rescue medication to be used if you can't catch your breath by resting or doing a relaxed purse lip breathing pattern.  - The less you use it, the better it will work when you need it. - Ok to use the inhaler up to 2 puffs  every 4 hours if you must but call for appointment if use goes up over your usual need - Don't leave home without it !!  (think of it like the spare tire for your car)  For cough > take your cough syrup (bag # 2)    Please remember to go to the lab department downstairs in the basement  for your tests - we will call you with the results when they are available.       See Tammy NP w/in 2 weeks with all your medications, even over the counter meds, separated in two separate bags, the ones you take no matter what vs the ones you stop  once you feel better and take only as needed when you feel you need them.   Tammy  will generate for you a new user friendly medication calendar that will put us all on the same page re: your medication use.     Without this process, it simply isn't possible to assure that we are providing  your outpatient care  with  the attention to detail we feel you deserve.   If we cannot assure that you're getting that kind of care,  then we cannot manage your problem effectively from this clinic.  Once you have seen Tammy and we are sure that we're all on the same page with your medication use she will arrange follow up with me.

## 2017-07-28 ENCOUNTER — Encounter: Payer: Self-pay | Admitting: Internal Medicine

## 2017-07-28 LAB — RESPIRATORY ALLERGY PROFILE REGION II ~~LOC~~
Allergen, Cottonwood, t14: <0.1 kU/L
Allergen, Mouse Urine Protein, e78: <0.1 kU/L
Allergen, Oak,t7: <0.1 kU/L
Bermuda Grass: <0.1 kU/L
CLADOSPORIUM HERBARUM (M2) IGE: <0.1 kU/L
CLASS: 0
CLASS: 0
CLASS: 0
CLASS: 0
CLASS: 0
CLASS: 0
CLASS: 0
CLASS: 0
CLASS: 0
CLASS: 0
CLASS: 0
CLASS: 0
CLASS: 0
CLASS: 0
Class: 0
Class: 0
Class: 0
Class: 0
Class: 0
Class: 0
Class: 0
Class: 0
Class: 0
Class: 0
Cockroach: <0.1 kU/L
Dog Dander: <0.1 kU/L
IgE (Immunoglobulin E), Serum: 4 kU/L (ref <=114)
Sheep Sorrel IgE: <0.1 kU/L
Timothy Grass: <0.1 kU/L

## 2017-07-28 LAB — INTERPRETATION:

## 2017-07-29 ENCOUNTER — Telehealth: Payer: Self-pay | Admitting: Internal Medicine

## 2017-07-29 NOTE — Telephone Encounter (Signed)
Returned Patient daughter's call Marylu Lund(Janet).  They were going back over AVS from LOV with MW 07/27/17.  Brovana nebs,twice per day, and budesonide nebs, twice per day.  She was unsure of frequency of the nebs.  She also was unsure about BP meds.  Per AVS, stop Atenolol and start Bisoprolol 5 mg tab daily.  Went over AVS instructions.  Marylu LundJanet was going to check to see if her Mother needed a refill for her Albuterol inhaler and call back this afternoon. Per MW AVS-  Instructions   I strongly recommend your eye doctor consider an alternative to your timolol eyedrops as they can cause cough and wheeze  Stop atenolol and replace bisoprolol 5 mg one daily (bag #1)      Plan A = Automatic = Brovana 15 mcg twice daily along budesonide 0.25 mg (Bag #1)    Plan B = Backup (Bag #2)  Only use your albuterol as a rescue medication to be used if you can't catch your breath by resting or doing a relaxed purse lip breathing pattern.  - The less you use it, the better it will work when you need it. - Ok to use the inhaler up to 2 puffs  every 4 hours if you must but call for appointment if use goes up over your usual need - Don't leave home without it !!  (think of it like the spare tire for your car)

## 2017-08-01 MED ORDER — ALBUTEROL SULFATE HFA 108 (90 BASE) MCG/ACT IN AERS: 2.0000 | INHALATION_SPRAY | RESPIRATORY_TRACT | 2 refills | Status: DC | PRN

## 2017-08-01 NOTE — Telephone Encounter (Signed)
Pt daughter Marylu LundJanet is calling back  [phone]

## 2017-08-01 NOTE — Telephone Encounter (Signed)
Left message for patient's daughter to call back in regards to the status of the albuterol inhaler.

## 2017-08-01 NOTE — Telephone Encounter (Signed)
Spoke with Belinda LundJanet. She did state that her mother needs a new RX for the albuterol inhaler. [facility] Advised her I would call this in now for her.   Nothing else needed at time of call.

## 2017-08-02 ENCOUNTER — Other Ambulatory Visit: Payer: Self-pay | Admitting: Cardiology

## 2017-08-23 ENCOUNTER — Ambulatory Visit (INDEPENDENT_AMBULATORY_CARE_PROVIDER_SITE_OTHER): Payer: Medicare Other | Admitting: Adult Health

## 2017-08-23 ENCOUNTER — Encounter: Payer: Self-pay | Admitting: Adult Health

## 2017-08-23 VITALS — BP 98/72 | HR 64 | Ht <= 58 in | Wt 156.4 lb

## 2017-08-23 DIAGNOSIS — J45901 Unspecified asthma with (acute) exacerbation: Secondary | ICD-10-CM

## 2017-08-23 DIAGNOSIS — J441 Chronic obstructive pulmonary disease with (acute) exacerbation: Secondary | ICD-10-CM | POA: Diagnosis not present

## 2017-08-23 DIAGNOSIS — R0609 Other forms of dyspnea: Secondary | ICD-10-CM | POA: Diagnosis not present

## 2017-08-23 NOTE — Assessment & Plan Note (Signed)
Pt may have component of Asthma . She does not have COPD  Improved control on current regimen  Patient's medications were reviewed today and patient education was given. Computerized medication calendar was adjusted/completed   Plan  Patient Instructions  Continue on current regimen .  Follow med calendar closely and bring to each visit.  Follow up with Dr. Sherene SiresWert  In 2-3 months and As needed     '

## 2017-08-23 NOTE — Patient Instructions (Signed)
Continue on current regimen .  Follow med calendar closely and bring to each visit.  Follow up with Dr. Sherene SiresWert  In 2-3 months and As needed

## 2017-08-23 NOTE — Progress Notes (Signed)
$'@Patient'I$  ID: Belinda [name], female    DOB: 01-03-1926, 84 y.o.   MRN: [id number]  Chief Complaint  Patient presents with  . Follow-up    Referring provider: Monico Blitz, MD  HPI: 84 yo female with minimal smoking hx seen for pulmonary consult for dyspnea and wheezing  PMH significant for CAD s/p DES to LAD   08/23/2017 Follow up : Dyspnea  Pt returns for 1 month follow up . Seen last ov for evaluation of dyspnea  And possible COPD . She has minimal smoking history . Previous spirometry years ago , showed normal lung function .  She was change from TRELEGY to Budesonide and Brovana . Coreg was changed to Bisoprolol and recommended to change off Timolol . Says she is feeling better with less dyspnea and resolution of wheezing .   We reviewed her meds and organized thems into a med calendar with pt education . Appears to be taking correctly . Meds are organized at [facility] . They do a good job of sorting her meds.   Spirometry today shows normal lung function with FEV1 at 92%, ratio 71, FVC 92.    No Known Allergies  Immunization History  Administered Date(s) Administered  . Influenza,inj,Quad PF,6+ Mos 12/09/2014    Past Medical History:  Diagnosis Date  . Borderline diabetes   . Coronary atherosclerosis of native coronary artery    DES LAD and circumflex 1/09, LVEF 70%  . Essential hypertension, benign   . GERD (gastroesophageal reflux disease)   . Glaucoma   . Mixed hyperlipidemia   . Overactive bladder     Tobacco History: Social History   Tobacco Use  Smoking Status Former Smoker  . Packs/day: 0.10  . Years: 2.00  . Pack years: 0.20  . Types: Cigarettes  . Last attempt to quit: 02/08/1946  . Years since quitting: 71.5  Smokeless Tobacco Never Used   Counseling given: Not Answered   Outpatient Medications Prior to Visit  Medication Sig Dispense Refill  . albuterol (PROVENTIL HFA;VENTOLIN HFA) 108 (90 Base) MCG/ACT inhaler Inhale 2 puffs into the lungs  every 4 (four) hours as needed for wheezing or shortness of breath. 1 Inhaler 2  . albuterol (PROVENTIL) (2.5 MG/3ML) 0.083% nebulizer solution 2 (two) times daily.    [name] amLODipine (NORVASC) 5 MG tablet TAKE ONE TABLET BY MOUTH EVERY DAY 60 tablet 0  . arformoterol (BROVANA) 15 MCG/2ML NEBU Take 2 mLs (15 mcg total) by nebulization 2 (two) times daily. 120 mL 0  . aspirin EC 81 MG tablet Take 1 tablet (81 mg total) by mouth daily. 90 tablet 3  . bisoprolol (ZEBETA) 5 MG tablet Take 1 tablet (5 mg total) by mouth daily. 30 tablet 11  . budesonide ([name]) 0.25 MG/2ML nebulizer solution One twice a day in nebulizer 120 mL 12  . dorzolamide-timolol (COSOPT) 22.3-6.8 MG/ML ophthalmic solution Place 1 drop into both eyes 2 (two) times daily.    . isosorbide mononitrate (IMDUR) 30 MG 24 hr tablet Take 1 tablet (30 mg total) by mouth daily. 30 tablet 1  . levothyroxine (SYNTHROID, LEVOTHROID) 25 MCG tablet Take 25 mcg by mouth daily before breakfast.    . meclizine (ANTIVERT) 25 MG tablet Take 1 tablet by mouth Twice daily as needed.    . Multiple Vitamins-Minerals (ICAPS AREDS 2) CAPS Take 1 tablet by mouth 2 (two) times daily.    . OXYGEN 2lpm with sleep and as needed during the day DME- [facility]    .  pantoprazole (PROTONIX) 40 MG tablet Take 30- 60 min before your first and last meals of the day 60 tablet 2  . pravastatin (PRAVACHOL) 20 MG tablet Take 10 mg by mouth daily.    . sertraline (ZOLOFT) 50 MG tablet Take 1 tablet by mouth daily.    . Vitamin D, Ergocalciferol, (DRISDOL) 50000 UNITS CAPS capsule Take 50,000 Units by mouth every 7 (seven) days.    [name] HYDROcodone-homatropine (HYCODAN) 5-1.5 MG/5ML syrup 5 ml every 6 hours as needed     No facility-administered medications prior to visit.      Review of Systems  Constitutional:   No  weight loss, night sweats,  Fevers, chills,  +fatigue, or  lassitude.  HEENT:   No headaches,  Difficulty swallowing,  Tooth/dental problems, or  Sore  throat,                No sneezing, itching, ear ache, nasal congestion, post nasal drip,   CV:  No chest pain,  Orthopnea, PND, swelling in lower extremities, anasarca, dizziness, palpitations, syncope.   GI  No heartburn, indigestion, abdominal pain, nausea, vomiting, diarrhea, change in bowel habits, loss of appetite, bloody stools.   Resp:  No excess mucus, no productive cough,  No non-productive cough,  No coughing up of blood.  No change in color of mucus.  No wheezing.  No chest wall deformity  Skin: no rash or lesions.  GU: no dysuria, change in color of urine, no urgency or frequency.  No flank pain, no hematuria   MS:  No joint pain or swelling.  No decreased range of motion.  No back pain.    Physical Exam  BP 98/72 (BP Location: Left Arm, Cuff Size: Normal)   Pulse 64   Ht '4\' 10"'$  (1.473 m)   Wt 156 lb 6.4 oz (70.9 kg)   BMI 32.69 kg/m   GEN: A/Ox3; pleasant , NAD, elderly , cane   HEENT:  /AT,  EACs-clear, TMs-wnl, NOSE-clear, THROAT-clear, no lesions, no postnasal drip or exudate noted.   NECK:  Supple w/ fair ROM; no JVD; normal carotid impulses w/o bruits; no thyromegaly or nodules palpated; no lymphadenopathy.    RESP  Clear  P & A; w/o, wheezes/ rales/ or rhonchi. no accessory muscle use, no dullness to percussion  CARD:  RRR, no m/r/g,tr  peripheral edema, pulses intact, no cyanosis or clubbing.  GI:   Soft & nt; nml bowel sounds; no organomegaly or masses detected.   Musco: Warm bil, no deformities or joint swelling noted.   Neuro: alert, no focal deficits noted.    Skin: Warm, no lesions or rashes    Lab Results:  CBC    Component Value Date/Time   WBC 9.2 07/27/2017 1230   RBC 4.22 07/27/2017 1230   HGB 13.5 07/27/2017 1230   HCT 40.4 07/27/2017 1230   PLT 168.0 07/27/2017 1230   MCV 95.6 07/27/2017 1230   MCHC 33.3 07/27/2017 1230   RDW 14.8 07/27/2017 1230   LYMPHSABS 1.8 07/27/2017 1230   MONOABS 0.9 07/27/2017 1230   EOSABS 0.1  07/27/2017 1230   BASOSABS 0.1 07/27/2017 1230    BMET    Component Value Date/Time   NA 141 07/27/2017 1230   K 4.8 07/27/2017 1230   CL 104 07/27/2017 1230   CO2 32 07/27/2017 1230   GLUCOSE 100 (H) 07/27/2017 1230   BUN 18 07/27/2017 1230   CREATININE 1.08 07/27/2017 1230   CALCIUM 9.5 07/27/2017 1230  GFRNONAA 56 (L) 02/22/2007 0340   GFRAA  02/22/2007 0340    >60        The eGFR has been calculated using the MDRD equation. This calculation has not been validated in all clinical situations. eGFR's persistently <60 mL/min signify possible Chronic Kidney Disease.    BNP No results found for: BNP  ProBNP    Component Value Date/Time   PROBNP 56.0 07/27/2017 1230    Imaging: No results found.   Assessment & Plan:   Asthma, chronic obstructive, with acute exacerbation ([place]) Pt may have component of Asthma . She does not have COPD  Improved control on current regimen  Patient's medications were reviewed today and patient education was given. Computerized medication calendar was adjusted/completed   Plan  Patient Instructions  Continue on current regimen .  Follow med calendar closely and bring to each visit.  Follow up with Dr. Melvyn Novas  In 2-3 months and As needed     '     [place], NP 08/23/2017

## 2017-08-24 NOTE — Progress Notes (Signed)
Chart and office note reviewed in detail  > agree with a/p as outlined    

## 2017-08-26 NOTE — Addendum Note (Signed)
Addended by: Boone MasterJONES, JESSICA E on: 08/26/2017 12:39 PM   Modules accepted: Orders

## 2017-08-29 ENCOUNTER — Telehealth: Payer: Self-pay | Admitting: Internal Medicine

## 2017-08-29 MED ORDER — BUDESONIDE 0.25 MG/2ML IN SUSP: RESPIRATORY_TRACT | 12 refills | Status: DC

## 2017-08-29 MED ORDER — ARFORMOTEROL TARTRATE 15 MCG/2ML IN NEBU
15.0000 ug | INHALATION_SOLUTION | Freq: Two times a day (BID) | RESPIRATORY_TRACT | 6 refills | Status: DC
Start: 2017-08-29 — End: 2018-04-03

## 2017-08-29 NOTE — Telephone Encounter (Signed)
Called and spoke to pt's daughter, Belinda [name].  Belinda [name] is requesting refill on [name] & Brovana. Per our records Rx for [name] was sent to pharmacy on 07/27/17 with 12 refills.  It does not appear that Rx for Rosalyn GessBrovana was sent to pharmacy, only sample provided.  I have called [name] to ensure refills on [name]. [name] there is not a Rx on file for Phelps DodgeBrovana or pulmicort.  Rx for both [name] & Rosalyn GessBrovana has been sent to preferred pharmacy.  Pt's daughter is aware.  Nothing further is needed.

## 2017-09-08 ENCOUNTER — Ambulatory Visit (INDEPENDENT_AMBULATORY_CARE_PROVIDER_SITE_OTHER): Payer: Medicare Other | Admitting: Otolaryngology

## 2017-09-08 DIAGNOSIS — H6121 Impacted cerumen, right ear: Secondary | ICD-10-CM | POA: Diagnosis not present

## 2017-09-08 DIAGNOSIS — H903 Sensorineural hearing loss, bilateral: Secondary | ICD-10-CM | POA: Diagnosis not present

## 2017-10-04 ENCOUNTER — Other Ambulatory Visit: Payer: Self-pay | Admitting: Cardiology

## 2017-10-24 ENCOUNTER — Encounter: Payer: Self-pay | Admitting: Internal Medicine

## 2017-10-24 ENCOUNTER — Ambulatory Visit: Payer: Medicare Other | Admitting: Internal Medicine

## 2017-10-24 VITALS — BP 118/68 | HR 76 | Ht <= 58 in | Wt 159.0 lb

## 2017-10-24 DIAGNOSIS — K219 Gastro-esophageal reflux disease without esophagitis: Secondary | ICD-10-CM | POA: Diagnosis not present

## 2017-10-24 DIAGNOSIS — J441 Chronic obstructive pulmonary disease with (acute) exacerbation: Secondary | ICD-10-CM | POA: Diagnosis not present

## 2017-10-24 DIAGNOSIS — J45901 Unspecified asthma with (acute) exacerbation: Secondary | ICD-10-CM | POA: Diagnosis not present

## 2017-10-24 DIAGNOSIS — G4734 Idiopathic sleep related nonobstructive alveolar hypoventilation: Secondary | ICD-10-CM | POA: Diagnosis not present

## 2017-10-24 NOTE — Progress Notes (Signed)
Subjective:     Patient ID: Belinda [name], female   DOB: March 09, 1925,   MRN: [id number]   Brief patient profile:  84 yowf  Quit smoking age 84 with seasonal symptoms of coughing sneezing worse since onset breathing problem around 2009  > cardiac eval :  DES to LAD and Cx 2009 with preserved LV fxn as of 2014.    History of Present Illness  07/27/2017 1st [facility] office visit/ Wert   Chief Complaint  Patient presents with  . Pulmonary Consult    Referred by Dr. Sherril Croon for eval of COPD.  Pt c/o SOB for the past 5-6 yrs, worse for the past 2 years. She states that she gets SOB "when I move alot".  She has had cough for at least the past month- prod with yellow to clear sputum. She states she often gets "choked up"  wearing 02 hs x sev years and sleeps ok on one pillow but p stirring  Every day x years now starts with coughing/choking worse with eating also and sob with minimal activity. Did not improve on symbicort x  Several year Dr Juanetta Gosling changed to trelegy x 6 monts prior to OV   [facility] and Orson Aloe do not feel she has copd per Daughter  rec I strongly recommend your eye doctor consider an alternative to your timolol eyedrops as they can cause cough and wheeze Stop atenolol and replace bisoprolol 5 mg one daily (bag #1)  Stop trelegy and fish oil  Change pantoprazole to 40 mg Take 30- 60 min before your first and last meals of the day  GERD   Plan A = Automatic = Brovana 15 mcg twice daily along budesonide 0.25 mg (Bag #1)  Plan B = Backup (Bag #2)  Only use your albuterol as a rescue medication  For cough > take your cough syrup (bag # 2)  08/23/17 NP Med calendar provided and rec use it and return to ov with it in hand   10/24/2017  f/u ov/Wert re: asthma / no med calendar  Chief Complaint  Patient presents with  . Follow-up    Breathing has improved some and she is wheezing less. She has not used her albuterol inhaler.   Dyspnea:  Strength and energy > sob limited /  still do some [employer] shopping Cough: none Sleeping: 30 degrees with lots of lots pillows  SABA use: none 02: 2lpm hs     No obvious day to day or daytime variability or assoc excess/ purulent sputum or mucus plugs or hemoptysis or cp or chest tightness, subjective wheeze or overt sinus or hb symptoms.   Sleeps as above  without nocturnal  or early am exacerbation  of respiratory  c/o's or need for noct saba. Also denies any obvious fluctuation of symptoms with weather or environmental changes or other aggravating or alleviating factors except as outlined above   No unusual exposure hx or h/o childhood pna/ asthma or knowledge of premature birth.  Current Allergies, Complete Past Medical History, Past Surgical History, Family History, and Social History were reviewed in [employer] record.  ROS  The following are not active complaints unless bolded Hoarseness, sore throat, dysphagia, dental problems, itching, sneezing,  nasal congestion or discharge of excess mucus or purulent secretions, ear ache,   fever, chills, sweats, unintended wt loss or wt gain, classically pleuritic or exertional cp,  orthopnea pnd or arm/hand swelling  or leg swelling, presyncope, palpitations, abdominal pain, anorexia, nausea, vomiting,  diarrhea  or change in bowel habits or change in bladder habits, change in stools or change in urine, dysuria, hematuria,  rash, arthralgias, visual complaints, headache, numbness, weakness or ataxia or problems with walking or coordination,  change in mood or  memory.        Current Meds  Medication Sig  . acetaminophen (TYLENOL) 325 MG tablet Per bottle as needed  . albuterol (PROVENTIL HFA;VENTOLIN HFA) 108 (90 Base) MCG/ACT inhaler Inhale 2 puffs into the lungs every 4 (four) hours as needed for wheezing or shortness of breath.  [name]. amLODipine (NORVASC) 5 MG tablet TAKE 1 TABLET BY MOUTH EVERY DAY  . arformoterol (BROVANA) 15 MCG/2ML NEBU Take 2 mLs (15 mcg  total) by nebulization 2 (two) times daily. j44.1  . aspirin EC 81 MG tablet Take 1 tablet (81 mg total) by mouth daily.  . bisoprolol (ZEBETA) 5 MG tablet Take 1 tablet (5 mg total) by mouth daily.  . budesonide ([name]) 0.25 MG/2ML nebulizer solution One twice a day in nebulizer  . dorzolamide-timolol (COSOPT) 22.3-6.8 MG/ML ophthalmic solution Place 1 drop into both eyes 2 (two) times daily.  . isosorbide mononitrate (IMDUR) 30 MG 24 hr tablet Take 1 tablet (30 mg total) by mouth daily.  [name]. latanoprost (XALATAN) 0.005 % ophthalmic solution Place 1 drop into both eyes at bedtime.  [name]. levothyroxine (SYNTHROID, LEVOTHROID) 25 MCG tablet Take 25 mcg by mouth daily before breakfast.  . meclizine (ANTIVERT) 25 MG tablet Take 1 tablet by mouth Twice daily as needed.  . Melatonin 3 MG TABS Take 1 tablet by mouth at bedtime as needed.  . Multiple Vitamins-Minerals (ICAPS AREDS 2) CAPS Take 1 tablet by mouth 2 (two) times daily.  . OXYGEN 2lpm with sleep and as needed during the day DME- [facility]  . pantoprazole (PROTONIX) 40 MG tablet Take 30- 60 min before your first and last meals of the day  . pravastatin (PRAVACHOL) 20 MG tablet 1/2 tab by mouth once daily  . sertraline (ZOLOFT) 50 MG tablet Take 1 tablet by mouth daily.  . Vitamin D, Ergocalciferol, (DRISDOL) 50000 UNITS CAPS capsule Take 50,000 Units by mouth every Wednesday.               Objective:   Physical Exam  amb wf walking with walker slt hoarse looks younger than stated age / chewing mint gum   10/24/2017       159   07/27/17 153 lb (69.4 kg)  12/10/16 159 lb 12.8 oz (72.5 kg)  04/20/16 156 lb 12.8 oz (71.1 kg)    Vital signs reviewed - Note on arrival 02 sats  92% on RA      HEENT: nl  turbinates bilaterally, and oropharynx. Nl external ear canals without cough reflex - full dentues    NECK :  without JVD/Nodes/TM/ nl carotid upstrokes bilaterally   LUNGS: no acc muscle use,  Nl contour chest which is clear to A  and P bilaterally without cough on insp or exp maneuvers   CV:  RRR  no s3 or murmur or increase in P2, and no edema   ABD:  soft and nontender with nl inspiratory excursion in the supine position. No bruits or organomegaly appreciated, bowel sounds nl  MS:  Walks slowly with walker/  ext warm without deformities, calf tenderness, cyanosis or clubbing No obvious joint restrictions   SKIN: warm and dry without lesions    NEURO:  alert, approp, nl sensorium with  no motor or  cerebellar deficits apparent.                         Assessment:

## 2017-10-24 NOTE — Patient Instructions (Signed)
No change in medications  GERD (REFLUX)  is an extremely common cause of respiratory symptoms just like yours , many times with no obvious heartburn at all.    It can be treated with medication, but also with lifestyle changes including elevation of the head of your bed (ideally with 6 inch  bed blocks),  Smoking cessation, avoidance of late meals, excessive alcohol, and avoid fatty foods, chocolate, peppermint, colas, red wine, and acidic juices such as orange juice.  NO MINT OR MENTHOL PRODUCTS SO NO COUGH DROPS   USE SUGARLESS CANDY INSTEAD (Jolley ranchers or Stover's or Life Savers) or even ice chips will also do - the key is to swallow to prevent all throat clearing. NO OIL BASED VITAMINS - use powdered substitutes.    Please schedule a follow up visit in 6 months but call sooner if needed

## 2017-10-25 ENCOUNTER — Encounter: Payer: Self-pay | Admitting: Internal Medicine

## 2017-10-25 DIAGNOSIS — G4734 Idiopathic sleep related nonobstructive alveolar hypoventilation: Secondary | ICD-10-CM | POA: Insufficient documentation

## 2017-10-25 NOTE — Assessment & Plan Note (Signed)
Reviewed diet (no mints!) and med rx

## 2017-10-25 NOTE — Assessment & Plan Note (Signed)
rx = 2lpm hs/ advised since only 2 lpm and obese it was likely she is still dipping below 88% on RA - may help to elevate hob as per prev instructions to get abd off thorax

## 2017-10-25 NOTE — Assessment & Plan Note (Signed)
Allergy profile 07/27/17  >  Eos 0.1 /  IgE  4 RAST neg    All goals of chronic asthma control met including optimal function and elimination of symptoms with minimal need for rescue therapy.  Contingencies discussed in full including contacting this office immediately if not controlling the symptoms using the rule of two's.     - The proper method of use, as well as anticipated side effects, of a metered-dose inhaler are discussed and demonstrated to the patient      I had an extended discussion with the patient/fm  reviewing all relevant studies completed to date and  lasting 15 to 20 minutes of a 25 minute visit    See device teaching which extended face to face time for this visit.  Each maintenance medication was reviewed in detail including emphasizing most importantly the difference between maintenance and prns and under what circumstances the prns are to be triggered using an action plan format that is not reflected in the computer generated alphabetically organized AVS which I have not found useful in most complex patients, especially with respiratory illnesses  Please see AVS for specific instructions unique to this visit that I personally wrote and verbalized to the the pt in detail and then reviewed with pt  by my nurse highlighting any  changes in therapy recommended at today's visit to their plan of care.

## 2017-11-03 ENCOUNTER — Other Ambulatory Visit: Payer: Self-pay | Admitting: Internal Medicine

## 2017-11-03 ENCOUNTER — Other Ambulatory Visit: Payer: Self-pay | Admitting: Cardiology

## 2018-01-31 ENCOUNTER — Other Ambulatory Visit: Payer: Self-pay | Admitting: Internal Medicine

## 2018-03-04 ENCOUNTER — Other Ambulatory Visit: Payer: Self-pay | Admitting: Cardiology

## 2018-04-02 ENCOUNTER — Other Ambulatory Visit: Payer: Self-pay | Admitting: Internal Medicine

## 2018-04-14 NOTE — Progress Notes (Signed)
Cardiology Office Note  Date: 04/17/2018   ID: Belinda [name], DOB February 05, 1926, MRN [id number]  PCP: Monico Blitz, MD  Primary Cardiologist: Rozann Lesches, MD   Chief Complaint  Patient presents with  . Coronary Artery Disease    History of Present Illness: Belinda [name] is a 85 y.o. female last seen in November 2018.  She presents overdue for follow-up with her daughter.  From a cardiac perspective, she reports no progressive angina symptoms at this time.  She continues to follow with Pulmonary, on nebulizer treatments and with supplemental oxygen.  She wears oxygen at nighttime, but has been reluctant to wear it with activity during the daytime.  Dyspnea on exertion remains a chronic problem.  I personally reviewed her ECG today which shows sinus rhythm with decreased anteroseptal R wave progression, lead motion artifact.  Current cardiac regimen includes aspirin, Norvasc, Zebeta, Imdur, and Pravachol.  She continues to follow lab work regularly with Dr. Manuella Ghazi including lipids.  Past Medical History:  Diagnosis Date  . Borderline diabetes   . Coronary atherosclerosis of native coronary artery    DES LAD and circumflex 1/09, LVEF 70%  . Essential hypertension, benign   . GERD (gastroesophageal reflux disease)   . Glaucoma   . Mixed hyperlipidemia   . Overactive bladder     Past Surgical History:  Procedure Laterality Date  . APPENDECTOMY    . BACK SURGERY    . CHOLECYSTECTOMY    . COLONOSCOPY  07/15/2011   Procedure: COLONOSCOPY;  Surgeon: Rogene Houston, MD;  Location: [facility];  Service: Endoscopy;  Laterality: N/A;  930  . HERNIA REPAIR    . TONSILLECTOMY      Current Outpatient Medications  Medication Sig Dispense Refill  . acetaminophen (TYLENOL) 325 MG tablet Per bottle as needed    . albuterol (PROVENTIL HFA;VENTOLIN HFA) 108 (90 Base) MCG/ACT inhaler Inhale 2 puffs into the lungs every 4 (four) hours as needed for wheezing or shortness of breath. 1  Inhaler 2  . amLODipine (NORVASC) 5 MG tablet TAKE 1 TABLET BY MOUTH DAILY 7 tablet 0  . aspirin EC 81 MG tablet Take 1 tablet (81 mg total) by mouth daily. 90 tablet 3  . bisoprolol (ZEBETA) 5 MG tablet Take 1 tablet (5 mg total) by mouth daily. 30 tablet 11  . BROVANA 15 MCG/2ML NEBU INHALE THE CONTENTS OF ONE VIAL TWICE DAILY 120 mL 6  . budesonide ([name]) 0.25 MG/2ML nebulizer solution One twice a day in nebulizer 120 mL 12  . dorzolamide-timolol (COSOPT) 22.3-6.8 MG/ML ophthalmic solution Place 1 drop into both eyes 2 (two) times daily.    . isosorbide mononitrate (IMDUR) 30 MG 24 hr tablet Take 1 tablet (30 mg total) by mouth daily. 30 tablet 1  . latanoprost (XALATAN) 0.005 % ophthalmic solution Place 1 drop into both eyes at bedtime.    [name] levothyroxine (SYNTHROID, LEVOTHROID) 25 MCG tablet Take 25 mcg by mouth daily before breakfast.    . meclizine (ANTIVERT) 25 MG tablet Take 1 tablet by mouth Twice daily as needed.    . Melatonin 3 MG TABS Take 1 tablet by mouth at bedtime as needed.    . Multiple Vitamins-Minerals (ICAPS AREDS 2) CAPS Take 1 tablet by mouth 2 (two) times daily.    . OXYGEN 2lpm with sleep and as needed during the day DME- [facility]    . pantoprazole (PROTONIX) 40 MG tablet TAKE 1 TABLET BY MOUTH TWICE DAILY 30 -  Onyx your first AND last meals of THE DAY 60 tablet 2  . pravastatin (PRAVACHOL) 20 MG tablet 1/2 tab by mouth once daily    . sertraline (ZOLOFT) 50 MG tablet Take 1 tablet by mouth daily.    . traMADol (ULTRAM) 50 MG tablet Take by mouth every 6 (six) hours as needed.    . Vitamin D, Ergocalciferol, (DRISDOL) 50000 UNITS CAPS capsule Take 50,000 Units by mouth every Wednesday.      No current facility-administered medications for this visit.    Allergies:  Patient has no known allergies.   Social History: The patient  reports that she quit smoking about 72 years ago. Her smoking use included cigarettes. She has a 0.20 pack-year smoking  history. She has never used smokeless tobacco. She reports that she does not drink alcohol or use drugs.   ROS:  Please see the history of present illness. Otherwise, complete review of systems is positive for hearing loss.  All other systems are reviewed and negative.   Physical Exam: VS:  BP 138/64   Pulse 64   Ht 4' 11"  (1.499 m)   Wt 159 lb (72.1 kg)   SpO2 94%   BMI 32.11 kg/m , BMI Body mass index is 32.11 kg/m.  Wt Readings from Last 3 Encounters:  04/17/18 159 lb (72.1 kg)  10/24/17 159 lb (72.1 kg)  08/23/17 156 lb 6.4 oz (70.9 kg)    General: Elderly woman, no distress.  Not wearing oxygen. HEENT: Conjunctiva and lids normal, oropharynx clear. Neck: Supple, no elevated JVP or carotid bruits, no thyromegaly. Lungs: Decreased breath sounds with prolonged expiratory phase but no wheezing, nonlabored breathing at rest. Cardiac: Regular rate and rhythm, no S3 or significant systolic murmur. Abdomen: Soft, nontender, bowel sounds present. Extremities: Trace ankle edema, distal pulses 1-2+. Skin: Warm and dry. Musculoskeletal: No kyphosis. Neuropsychiatric: Alert and oriented x3, affect grossly appropriate.  ECG: I personally reviewed the tracing from 04/20/2016 which showed sinus rhythm with old anteroseptal infarct pattern.  Recent Labwork: 07/27/2017: BUN 18; Creatinine, Ser 1.08; Hemoglobin 13.5; Platelets 168.0; Potassium 4.8; Pro B Natriuretic peptide (BNP) 56.0; Sodium 141; TSH 4.27   Other Studies Reviewed Today:  Echocardiogram 10/30/2012 [facility]): LVEF 09-32% with diastolic dysfunction, Mac with trace mitral regurgitation, mildly sclerotic aortic valve, mild tricuspid regurgitation with normal RVSP, mildly dilated right ventricle.  Assessment and Plan:  1.  CAD status post DES to the LAD and circumflex in 2009.  Plan is to continue conservative management, she reports no progressive angina symptoms on medical therapy.  ECG reviewed and stable.  No  changes were made today.  2.  Mixed hyperlipidemia, on Pravachol with follow-up by Dr. Manuella Ghazi.  3.  Essential hypertension, blood pressure is reasonable today.  No changes made in current regimen.  4.  Chronic dyspnea on exertion.  Not felt to be COPD on evaluation by Dr. Melvyn Novas.  Current medicines were reviewed with the patient today.   Orders Placed This Encounter  Procedures  . EKG 12-Lead    Disposition: Follow-up in 6 months.  Signed, Satira Sark, MD, [facility] 04/17/2018 9:25 AM    [place] [facility] [place], [place], [place] [address] Phone: [phone]; Fax: [phone]

## 2018-04-17 ENCOUNTER — Encounter: Payer: Self-pay | Admitting: Cardiology

## 2018-04-17 ENCOUNTER — Ambulatory Visit: Payer: Medicare Other | Admitting: Cardiology

## 2018-04-17 ENCOUNTER — Encounter: Payer: Self-pay | Admitting: *Deleted

## 2018-04-17 VITALS — BP 138/64 | HR 64 | Ht 59.0 in | Wt 159.0 lb

## 2018-04-17 DIAGNOSIS — E782 Mixed hyperlipidemia: Secondary | ICD-10-CM | POA: Diagnosis not present

## 2018-04-17 DIAGNOSIS — I10 Essential (primary) hypertension: Secondary | ICD-10-CM | POA: Diagnosis not present

## 2018-04-17 DIAGNOSIS — R0609 Other forms of dyspnea: Secondary | ICD-10-CM

## 2018-04-17 DIAGNOSIS — I25119 Atherosclerotic heart disease of native coronary artery with unspecified angina pectoris: Secondary | ICD-10-CM

## 2018-04-17 NOTE — Patient Instructions (Signed)
Medication Instructions:  Continue all current medications.   Follow-Up: Your physician wants you to follow up in: 6 months.  You will receive a reminder letter in the mail one-two months in advance.  If you don't receive a letter, please call our office to schedule the follow up appointment   Any Other Special Instructions Will Be Listed Below (If Applicable).  If you need a refill on your cardiac medications before your next appointment, please call your pharmacy.  

## 2018-04-18 ENCOUNTER — Ambulatory Visit: Payer: Medicare Other | Admitting: Cardiology

## 2018-04-24 ENCOUNTER — Ambulatory Visit: Payer: Medicare Other | Admitting: Internal Medicine

## 2018-04-30 ENCOUNTER — Other Ambulatory Visit: Payer: Self-pay | Admitting: Internal Medicine

## 2018-05-01 ENCOUNTER — Ambulatory Visit: Payer: Medicare Other | Admitting: Internal Medicine

## 2018-05-12 ENCOUNTER — Telehealth: Payer: Self-pay | Admitting: Internal Medicine

## 2018-05-12 MED ORDER — ARFORMOTEROL TARTRATE 15 MCG/2ML IN NEBU: INHALATION_SOLUTION | RESPIRATORY_TRACT | 6 refills | Status: DC

## 2018-05-12 NOTE — Telephone Encounter (Signed)
Called Dolly back. Due to J44.9 be put on rx for Brovana pts copay tripled. Will resend rx as she does not have dx of COPD, but asthma. Nothing further needed.

## 2018-05-12 NOTE — Telephone Encounter (Signed)
[facility]. Steele Sizer does not come in until 46. Asked to call back. Will call back at 9.

## 2018-06-09 ENCOUNTER — Other Ambulatory Visit: Payer: Self-pay | Admitting: Cardiology

## 2018-06-12 ENCOUNTER — Telehealth: Payer: Self-pay | Admitting: Cardiology

## 2018-06-12 NOTE — Telephone Encounter (Signed)
Amlodipine 5 mg daily was sent 06/09/18 #30 - pt needs appt for further refills which was noted on refill

## 2018-06-12 NOTE — Telephone Encounter (Signed)
Please send new refill for Amlodopine to [facility]/tg

## 2018-06-28 ENCOUNTER — Encounter: Payer: Self-pay | Admitting: Internal Medicine

## 2018-06-28 ENCOUNTER — Other Ambulatory Visit: Payer: Self-pay

## 2018-06-28 ENCOUNTER — Ambulatory Visit: Payer: Medicare Other | Admitting: Internal Medicine

## 2018-06-28 VITALS — BP 120/60 | HR 63 | Temp 98.1°F | Ht 59.0 in | Wt 162.0 lb

## 2018-06-28 DIAGNOSIS — J45901 Unspecified asthma with (acute) exacerbation: Secondary | ICD-10-CM

## 2018-06-28 DIAGNOSIS — J441 Chronic obstructive pulmonary disease with (acute) exacerbation: Secondary | ICD-10-CM

## 2018-06-28 DIAGNOSIS — J9611 Chronic respiratory failure with hypoxia: Secondary | ICD-10-CM | POA: Insufficient documentation

## 2018-06-28 DIAGNOSIS — K219 Gastro-esophageal reflux disease without esophagitis: Secondary | ICD-10-CM | POA: Diagnosis not present

## 2018-06-28 NOTE — Assessment & Plan Note (Signed)
May well be contributing to worse sense of sob / noct "wheeze"   rec max rx with ppi/ diet/ continue HOB up

## 2018-06-28 NOTE — Assessment & Plan Note (Signed)
Onset ? Around 2009 - Allergy profile 07/27/17  >  Eos 0.1 /  IgE  4 RAST neg   No evidence of active wheeze on laba/ics per neb > no change rx

## 2018-06-28 NOTE — Progress Notes (Signed)
Subjective:     Patient ID: Belinda [name], female   DOB: 1925/06/25,   MRN: [id number]   Brief patient profile:  [age] yowf  Quit smoking age 85 with seasonal symptoms of coughing sneezing worse since onset breathing problem around 2009  > cardiac eval :  DES to LAD and Cx 2009 with preserved LV fxn as of 2014.    History of Present Illness  07/27/2017 1st [facility] office visit/    Chief Complaint  Patient presents with  . Pulmonary Consult    Referred by Dr. Sherril CroonVyas for eval of COPD.  Pt c/o SOB for the past 5-6 yrs, worse for the past 2 years. She states that she gets SOB "when I move alot".  She has had cough for at least the past month- prod with yellow to clear sputum. She states she often gets "choked up"  wearing 02 hs x sev years and sleeps ok on one pillow but p stirring  Every day x years now starts with coughing/choking worse with eating also and sob with minimal activity. Did not improve on symbicort x  Several year Dr Juanetta GoslingHawkins changed to trelegy x 6 monts prior to OV   Hawkins and Orson AloeHenderson do not feel she has copd per Daughter  rec I strongly recommend your eye doctor consider an alternative to your timolol eyedrops as they can cause cough and wheeze Stop atenolol and replace bisoprolol 5 mg one daily (bag #1)  Stop trelegy and fish oil  Change pantoprazole to 40 mg Take 30- 60 min before your first and last meals of the day  GERD   Plan A = Automatic = Brovana 15 mcg twice daily along budesonide 0.25 mg (Bag #1)  Plan B = Backup (Bag #2)  Only use your albuterol as a rescue medication  For cough > take your cough syrup (bag # 2)  08/23/17 NP Med calendar provided and rec use it and return to ov with it in hand   10/24/2017  f/u ov/ re: asthma / no med calendar  Chief Complaint  Patient presents with  . Follow-up    Breathing has improved some and she is wheezing less. She has not used her albuterol inhaler.   Dyspnea:  Strength and energy > sob limited /  still do some [employer] shopping Cough: none Sleeping: 30 degrees with lots of lots pillows  SABA use: none 02: 2lpm hs   rec No change in medications GERD diet    06/28/2018  f/u ov/ re: asthma on brova/bid bid did not bring meds or med calendar Chief Complaint  Patient presents with  . Follow-up    Breathing is worse.   Dyspnea:  Was able to do [employer] walking s 02 some walking around house/yard but worse with wheezing on ex since x sev months / indolent but minimally progressive Cough: not much at all  Sleeping: pillows get her to  30 degrees HOB and sleeps fine with head raised  SABA use: maybe daily  02: just 2lpm hs - refuses to wear daytime per daughter    No obvious day to day or daytime variability or assoc excess/ purulent sputum or mucus plugs or hemoptysis or cp or chest tightness,or overt sinus or hb symptoms.   Sleeping as above  without nocturnal  or early am exacerbation  of respiratory  c/o's or need for noct saba. Also denies any obvious fluctuation of symptoms with weather or environmental changes or other aggravating or alleviating factors  except as outlined above   No unusual exposure hx or h/o childhood pna/ asthma or knowledge of premature birth.  Current Allergies, Complete Past Medical History, Past Surgical History, Family History, and Social History were reviewed in [employer] record.  ROS  The following are not active complaints unless bolded Hoarseness, sore throat, dysphagia, dental problems, itching, sneezing,  nasal congestion or discharge of excess mucus or purulent secretions, ear ache,   fever, chills, sweats, unintended wt loss or wt gain, classically pleuritic or exertional cp,  orthopnea pnd or arm/hand swelling  or leg swelling, presyncope, palpitations, abdominal pain, anorexia, nausea, vomiting, diarrhea  or change in bowel habits or change in bladder habits, change in stools or change in urine, dysuria, hematuria,   rash, arthralgias, visual complaints, headache, numbness, weakness or ataxia or problems with walking or coordination,  change in mood or  memory.        Current Meds  Medication Sig  . acetaminophen (TYLENOL) 325 MG tablet Per bottle as needed  . albuterol (PROVENTIL HFA;VENTOLIN HFA) 108 (90 Base) MCG/ACT inhaler Inhale 2 puffs into the lungs every 4 (four) hours as needed for wheezing or shortness of breath.  [name] amLODipine (NORVASC) 5 MG tablet TAKE 1 TABLET BY MOUTH DAILY --PT NEEDS OFFICE VISIT  . arformoterol (BROVANA) 15 MCG/2ML NEBU INHALE THE CONTENTS OF ONE VIAL TWICE DAILY  . aspirin EC 81 MG tablet Take 1 tablet (81 mg total) by mouth daily.  . bisoprolol (ZEBETA) 5 MG tablet Take 1 tablet (5 mg total) by mouth daily.  . budesonide ([name]) 0.25 MG/2ML nebulizer solution One twice a day in nebulizer  . Dorzolamide  22.3-  ophthalmic solution Place 1 drop into both eyes 2 (two) times daily.  . isosorbide mononitrate (IMDUR) 30 MG 24 hr tablet Take 1 tablet (30 mg total) by mouth daily.  [name] latanoprost (XALATAN) 0.005 % ophthalmic solution Place 1 drop into both eyes at bedtime.  [name] levothyroxine (SYNTHROID, LEVOTHROID) 25 MCG tablet Take 25 mcg by mouth daily before breakfast.  . meclizine (ANTIVERT) 25 MG tablet Take 1 tablet by mouth Twice daily as needed.  . Melatonin 3 MG TABS Take 1 tablet by mouth at bedtime as needed.  . Multiple Vitamins-Minerals (ICAPS AREDS 2) CAPS Take 1 tablet by mouth 2 (two) times daily.  . OXYGEN 2lpm with sleep and as needed during the day DME- [facility]  . pantoprazole (PROTONIX) 40 MG tablet TAKE 1 TABLET BY MOUTH TWICE DAILY 30-60 MINUTES BEFORE MEALS  . pravastatin (PRAVACHOL) 20 MG tablet 1/2 tab by mouth once daily  . sertraline (ZOLOFT) 50 MG tablet Take 1 tablet by mouth daily.  . traMADol (ULTRAM) 50 MG tablet Take by mouth every 6 (six) hours as needed.  . Vitamin D, Ergocalciferol, (DRISDOL) 50000 UNITS CAPS capsule Take 50,000 Units by  mouth every Wednesday.                     Objective:   Physical Exam   amb wf nad   06/28/2018        162  10/24/2017       159   07/27/17 153 lb (69.4 kg)  12/10/16 159 lb 12.8 oz (72.5 kg)  04/20/16 156 lb 12.8 oz (71.1 kg)    Vital signs reviewed - Note on arrival 02 sats  94% on RA          Obese wf nad / very easily confused with any and  all details of care   HEENT: nl dentition, turbinates bilaterally, and oropharynx. Nl external ear canals without cough reflex   NECK :  without JVD/Nodes/TM/ nl carotid upstrokes bilaterally   LUNGS: no acc muscle use,  Nl contour chest which is clear to A and P bilaterally though BS distant  without cough on insp or exp maneuvers   CV:  RRR  no s3 or murmur or increase in P2, and trace lower ext edema sym bilaterally   ABD:  Tensely obese and nontender with nl inspiratory excursion in the supine position. No bruits or organomegaly appreciated, bowel sounds nl  MS:  Nl gait/ ext warm without deformities, calf tenderness, cyanosis or clubbing No obvious joint restrictions   SKIN: warm and dry without lesions    NEURO:  alert, approp, nl sensorium with  no motor or cerebellar deficits apparent.         Blood work at [facility] 06/1918 requested   Acute abd with upright cxr 06/27/18  Mild CM/   Lungs clear        Assessment:

## 2018-06-28 NOTE — Patient Instructions (Addendum)
Protonix should be Take 30 min before your first and last meals of the day   Synthroid can be one hour before   02 should be 2lpm at bedtime with activity more than walking across the room   Please schedule a follow up office visit in 6 weeks, call sooner if needed

## 2018-06-29 ENCOUNTER — Encounter: Payer: Self-pay | Admitting: Internal Medicine

## 2018-06-29 ENCOUNTER — Other Ambulatory Visit: Payer: Self-pay | Admitting: Internal Medicine

## 2018-06-29 NOTE — Assessment & Plan Note (Addendum)
06/28/2018 Patient saturation on room air at rest = 94% >>>  while ambulating = 84% x 125 ft slow pace >  Placed on 2 liters of O2 while ambulating = 97%    As of 06/28/2018 rec 2lpm hs and with activity > room to room walking with goal of > 90% at all times    I had an extended discussion with the patient /daughter on speaker phone reviewing all relevant studies completed to date and  lasting 15 to 20 minutes of a 25 minute visit  which included directly observing ambulatory 02 saturation study documented in a/p section of  today's  office note.  Each maintenance medication was reviewed in detail including most importantly the difference between maintenance and prns and under what circumstances the prns are to be triggered using an action plan format that is not reflected in the computer generated alphabetically organized AVS.     Please see AVS for specific instructions unique to this visit that I personally wrote and verbalized to the the pt in detail and then reviewed with pt  by my nurse highlighting any changes in therapy recommended at today's visit .

## 2018-07-31 ENCOUNTER — Other Ambulatory Visit: Payer: Self-pay | Admitting: Internal Medicine

## 2018-08-07 ENCOUNTER — Ambulatory Visit: Payer: Medicare Other | Admitting: Internal Medicine

## 2018-08-09 ENCOUNTER — Ambulatory Visit: Payer: Medicare Other | Admitting: Internal Medicine

## 2018-08-09 ENCOUNTER — Encounter: Payer: Self-pay | Admitting: Internal Medicine

## 2018-08-09 ENCOUNTER — Other Ambulatory Visit: Payer: Self-pay

## 2018-08-09 ENCOUNTER — Ambulatory Visit (INDEPENDENT_AMBULATORY_CARE_PROVIDER_SITE_OTHER): Payer: Medicare Other

## 2018-08-09 DIAGNOSIS — R0609 Other forms of dyspnea: Secondary | ICD-10-CM | POA: Diagnosis not present

## 2018-08-09 DIAGNOSIS — J45901 Unspecified asthma with (acute) exacerbation: Secondary | ICD-10-CM | POA: Diagnosis not present

## 2018-08-09 DIAGNOSIS — J441 Chronic obstructive pulmonary disease with (acute) exacerbation: Secondary | ICD-10-CM

## 2018-08-09 DIAGNOSIS — J9611 Chronic respiratory failure with hypoxia: Secondary | ICD-10-CM

## 2018-08-09 LAB — CBC WITH DIFFERENTIAL/PLATELET
Basophils Absolute: 0.1 10*3/uL (ref 0.0–0.1)
Basophils Relative: 0.9 % (ref 0.0–3.0)
Eosinophils Absolute: 0.1 10*3/uL (ref 0.0–0.7)
Eosinophils Relative: 1.6 % (ref 0.0–5.0)
HCT: 38.1 % (ref 36.0–46.0)
Hemoglobin: 12.5 g/dL (ref 12.0–15.0)
Lymphocytes Relative: 28.9 % (ref 12.0–46.0)
Lymphs Abs: 2.1 10*3/uL (ref 0.7–4.0)
MCHC: 32.9 g/dL (ref 30.0–36.0)
MCV: 100 fl (ref 78.0–100.0)
Monocytes Absolute: 0.6 10*3/uL (ref 0.1–1.0)
Monocytes Relative: 8.8 % (ref 3.0–12.0)
Neutro Abs: 4.4 10*3/uL (ref 1.4–7.7)
Neutrophils Relative %: 59.8 % (ref 43.0–77.0)
Platelets: 144 10*3/uL — ABNORMAL LOW (ref 150.0–400.0)
RBC: 3.81 Mil/uL — ABNORMAL LOW (ref 3.87–5.11)
RDW: 13.6 % (ref 11.5–15.5)
WBC: 7.3 10*3/uL (ref 4.0–10.5)

## 2018-08-09 LAB — BASIC METABOLIC PANEL
BUN: 19 mg/dL (ref 6–23)
CO2: 27 mEq/L (ref 19–32)
Calcium: 8.9 mg/dL (ref 8.4–10.5)
Chloride: 104 mEq/L (ref 96–112)
Creatinine, Ser: 1.1 mg/dL (ref 0.40–1.20)
GFR: 46.57 mL/min — ABNORMAL LOW (ref >60.00)
Glucose, Bld: 92 mg/dL (ref 70–99)
Potassium: 4.1 mEq/L (ref 3.5–5.1)
Sodium: 140 mEq/L (ref 135–145)

## 2018-08-09 LAB — TSH: TSH: 5.35 u[IU]/mL — ABNORMAL HIGH (ref 0.35–4.50)

## 2018-08-09 LAB — BRAIN NATRIURETIC PEPTIDE: Pro B Natriuretic peptide (BNP): 122 pg/mL — ABNORMAL HIGH (ref 0.0–100.0)

## 2018-08-09 NOTE — Progress Notes (Signed)
Subjective:     Patient ID: Belinda LamerNancy F Thammavong, female   DOB: Oct 29, 1939,   MRN: [id number]   Brief patient profile:  85 yowf  Quit smoking age 85 with seasonal symptoms of coughing sneezing worse since onset breathing problem around 2009  > cardiac eval :  DES to LAD and Cx 2009 with preserved LV fxn as of 2014.    History of Present Illness  07/27/2017 1st [facility] office visit/ Hortense Cantrall   Chief Complaint  Patient presents with  . Pulmonary Consult    Referred by Dr. Sherril CroonVyas for eval of COPD.  Pt c/o SOB for the past 5-6 yrs, worse for the past 2 years. She states that she gets SOB "when I move alot".  She has had cough for at least the past month- prod with yellow to clear sputum. She states she often gets "choked up"  wearing 02 hs x sev years and sleeps ok on one pillow but p stirring  Every day x years now starts with coughing/choking worse with eating also and sob with minimal activity. Did not improve on symbicort x  Several year Dr Juanetta GoslingHawkins changed to trelegy x 6 monts prior to OV   Hawkins and Orson AloeHenderson do not feel she has copd per Daughter  rec I strongly recommend your eye doctor consider an alternative to your timolol eyedrops as they can cause cough and wheeze Stop atenolol and replace bisoprolol 5 mg one daily (bag #1)  Stop trelegy and fish oil  Change pantoprazole to 40 mg Take 30- 60 min before your first and last meals of the day  GERD   Plan A = Automatic = Brovana 15 mcg twice daily along budesonide 0.25 mg (Bag #1)  Plan B = Backup (Bag #2)  Only use your albuterol as a rescue medication  For cough > take your cough syrup (bag # 2)  08/23/17 NP Med calendar provided and rec use it and return to ov with it in hand   10/24/2017  f/u ov/Nahima Ales re: asthma / no med calendar  Chief Complaint  Patient presents with  . Follow-up    Breathing has improved some and she is wheezing less. She has not used her albuterol inhaler.   Dyspnea:  Strength and energy > sob limited /  still do some [employer] shopping Cough: none Sleeping: 30 degrees with lots of lots pillows  SABA use: none 02: 2lpm hs   rec No change in medications GERD diet    06/28/2018  f/u ov/Modine Oppenheimer re: asthma on brova/bid bid did not bring meds or med calendar Chief Complaint  Patient presents with  . Follow-up    Breathing is worse.   Dyspnea:  Was able to do [employer] walking s 02 some walking around house/yard but worse with wheezing on ex since x sev months / indolent but minimally progressive Cough: not much at all  Sleeping: pillows get her to  30 degrees HOB and sleeps fine with head raised  SABA use: maybe daily  02: just 2lpm hs - refuses to wear daytime per daughter  rec Protonix should be Take 30 min before your first and last meals of the day  Synthroid can be one hour before  02 should be 2lpm at bedtime with activity more than walking across the room    08/09/2018  f/u ov/Naseer Hearn re: asthma/ chronic resp failure, did not bring meds or 02  Chief Complaint  Patient presents with  . Follow-up    Breathing is  about the same. She has been wheezing more. She is not using her albuterol inhaler.   Dyspnea:  Mailbox and back s stopping is her goal  Cough: not much, gets choked some with food/ already on  on ppi but not bid ac consistently  Sleeping: 30 degrees does ok  SABA use: none  02: 2lpm hs and not using at all daytime - "left it in car"    No obvious day to day or daytime variability or assoc excess/ purulent sputum or mucus plugs or hemoptysis or cp or chest tightness,  or overt sinus or hb symptoms.   Sleeping as above without nocturnal  or early am exacerbation  of respiratory  c/o's or need for noct saba. Also denies any obvious fluctuation of symptoms with weather or environmental changes or other aggravating or alleviating factors except as outlined above   No unusual exposure hx or h/o childhood pna/ asthma or knowledge of premature birth.  Current Allergies, Complete Past  Medical History, Past Surgical History, Family History, and Social History were reviewed in [employer] record.  ROS  The following are not active complaints unless bolded Hoarseness, sore throat, dysphagia, dental problems, itching, sneezing,  nasal congestion or discharge of excess mucus or purulent secretions, ear ache,   fever, chills, sweats, unintended wt loss or wt gain, classically pleuritic or exertional cp,  orthopnea pnd or arm/hand swelling  or leg swelling, presyncope, palpitations, abdominal pain, anorexia, nausea, vomiting, diarrhea  or change in bowel habits or change in bladder habits, change in stools or change in urine, dysuria, hematuria,  rash, arthralgias, visual complaints, headache, numbness, weakness or ataxia or problems with walking or coordination,  change in mood or  memory.        Current Meds  Medication Sig  . acetaminophen (TYLENOL) 325 MG tablet Per bottle as needed  . albuterol (PROVENTIL HFA;VENTOLIN HFA) 108 (90 Base) MCG/ACT inhaler Inhale 2 puffs into the lungs every 4 (four) hours as needed for wheezing or shortness of breath.  [name] amLODipine (NORVASC) 5 MG tablet TAKE 1 TABLET BY MOUTH DAILY --PT NEEDS OFFICE VISIT  . arformoterol (BROVANA) 15 MCG/2ML NEBU INHALE THE CONTENTS OF ONE VIAL TWICE DAILY  . aspirin EC 81 MG tablet Take 1 tablet (81 mg total) by mouth daily.  . bisoprolol (ZEBETA) 5 MG tablet TAKE 1 TABLET BY MOUTH DAILY  . budesonide ([name]) 0.25 MG/2ML nebulizer solution One twice a day in nebulizer  . dorzolamide-timolol (COSOPT) 22.3-6.8 MG/ML ophthalmic solution Place 1 drop into both eyes 2 (two) times daily.  . isosorbide mononitrate (IMDUR) 30 MG 24 hr tablet Take 1 tablet (30 mg total) by mouth daily.  [name] latanoprost (XALATAN) 0.005 % ophthalmic solution Place 1 drop into both eyes at bedtime.  [name] levothyroxine (SYNTHROID, LEVOTHROID) 25 MCG tablet Take 25 mcg by mouth daily before breakfast.  . meclizine  (ANTIVERT) 25 MG tablet Take 1 tablet by mouth Twice daily as needed.  . Melatonin 3 MG TABS Take 1 tablet by mouth at bedtime as needed.  . Multiple Vitamins-Minerals (ICAPS AREDS 2) CAPS Take 1 tablet by mouth 2 (two) times daily.  . OXYGEN 2lpm with sleep and as needed during the day DME- [facility]  . pantoprazole (PROTONIX) 40 MG tablet TAKE 1 TABLET BY MOUTH TWICE DAILY 30-60 MINUTES BEFORE MEALS  . pravastatin (PRAVACHOL) 20 MG tablet 1/2 tab by mouth once daily  . sertraline (ZOLOFT) 50 MG tablet Take 1 tablet by mouth daily.  [name]  traMADol (ULTRAM) 50 MG tablet Take by mouth every 6 (six) hours as needed.  . Vitamin D, Ergocalciferol, (DRISDOL) 50000 UNITS CAPS capsule Take 50,000 Units by mouth every Wednesday.                       Objective:   Physical Exam  amb elderly wf nad   08/09/2018          159  06/28/2018        162  10/24/2017       159   07/27/17 153 lb (69.4 kg)  12/10/16 159 lb 12.8 oz (72.5 kg)  04/20/16 156 lb 12.8 oz (71.1 kg)      Vital signs reviewed - Note on arrival 02 sats  94% on RA    HEENT: nl dentition, turbinates bilaterally, and oropharynx. Nl external ear canals without cough reflex   NECK :  without JVD/Nodes/TM/ nl carotid upstrokes bilaterally   LUNGS: no acc muscle use,  Nl contour chest which is clear to A and P bilaterally without cough on insp or exp maneuvers   CV:  RRR  no s3 or murmur or increase in P2, and no edema   ABD:  soft and nontender with nl inspiratory excursion in the supine position. No bruits or organomegaly appreciated, bowel sounds nl  MS:  Nl gait/ ext warm without deformities, calf tenderness, cyanosis or clubbing No obvious joint restrictions   SKIN: warm and dry without lesions    NEURO:  alert, approp, nl sensorium with  no motor or cerebellar deficits apparent.    CXR PA and Lateral:   08/09/2018 :    I personally reviewed images and agree with radiology impression as follows:  No active  cardiopulmonary disease   Labs ordered/ reviewed:      Chemistry      Component Value Date/Time   NA 140 08/09/2018 1429   K 4.1 08/09/2018 1429   CL 104 08/09/2018 1429   CO2 27 08/09/2018 1429   BUN 19 08/09/2018 1429   CREATININE 1.10 08/09/2018 1429      Component Value Date/Time   CALCIUM 8.9 08/09/2018 1429   ALKPHOS 72 02/21/2007 0633   AST 22 02/21/2007 0633   ALT 18 02/21/2007 0633   BILITOT 0.5 02/21/2007 0633        Lab Results  Component Value Date   WBC 7.3 08/09/2018   HGB 12.5 08/09/2018   HCT 38.1 08/09/2018   MCV 100.0 08/09/2018   PLT 144.0 (L) 08/09/2018        Lab Results  Component Value Date   TSH 5.35 (H) 08/09/2018     Lab Results  Component Value Date   PROBNP 122.0 (H) 08/09/2018                       Assessment:

## 2018-08-09 NOTE — Progress Notes (Signed)
LMTCB

## 2018-08-09 NOTE — Patient Instructions (Addendum)
Protonix 40 mg Take 30- 60 min before your first and last meals of the day      Continue 02 2lpm at bedtime and only use daytime to keep sats over 90% if needed   Please remember to go to the lab and x-ray department   for your tests - we will call you with the results when they are available.     Please schedule a follow up visit in 3 months but call sooner if needed

## 2018-08-13 ENCOUNTER — Encounter: Payer: Self-pay | Admitting: Internal Medicine

## 2018-08-13 NOTE — Assessment & Plan Note (Signed)
Onset ? Around 2009  - Allergy profile 07/27/17  >  Eos 0.1 /  IgE  4 RAST neg    No evidence of any active wheezing on budesonide and formoterol bid per neb.  ? Component of uacs/ pseudoasthma from gerd > try max rx with ppi/ diet and f/u in 3 m, sooner if needed with all meds in hand using a trust but verify approach to confirm accurate Medication  Reconciliation The principal here is that until we are certain that the  patients are doing what we've asked, it makes no sense to ask them to do more.

## 2018-08-13 NOTE — Assessment & Plan Note (Signed)
Onset around 2014  - 08/09/2018   Walked RA x one lap =  approx 250 ft - stopped due to  Sob at slow pace with walker but no desats   No obvious asthma, chf, anemia, or abn buffering (no bicarb) and suspect much of this is geriactric decline.   Her thyroid is a bit low likely but not causing any sob > defer rx to PCP (Dr Manuella Ghazi)

## 2018-08-13 NOTE — Assessment & Plan Note (Signed)
06/28/2018 Patient saturation on room air at rest = 94% >>>  while ambulating = 84% x 125 ft slow pace >  Placed on 2 liters of O2 while ambulating = 97% - 08/09/2018  Did not desaturate walking 250 ft though was sob    As of 08/09/2018 rec 2lpm hs and prn daytime if sats dip < 90% with activity  I had an extended discussion with the patient  And daughter Marcie Bal reviewing all relevant studies completed to date and  lasting 15 to 20 minutes of a 25 minute visit  which included directly observing ambulatory 02 saturation study documented in a/p section of  today's  office note.  Each maintenance medication was reviewed in detail including most importantly the difference between maintenance and prns and under what circumstances the prns are to be triggered using an action plan format that is not reflected in the computer generated alphabetically organized AVS.     Please see AVS for specific instructions unique to this visit that I personally wrote and verbalized to the the pt in detail and then reviewed with pt  by my nurse highlighting any changes in therapy recommended at today's visit .

## 2018-08-30 ENCOUNTER — Other Ambulatory Visit: Payer: Self-pay | Admitting: Internal Medicine

## 2018-09-14 ENCOUNTER — Ambulatory Visit (INDEPENDENT_AMBULATORY_CARE_PROVIDER_SITE_OTHER): Payer: Medicare Other | Admitting: Otolaryngology

## 2018-09-14 DIAGNOSIS — H6121 Impacted cerumen, right ear: Secondary | ICD-10-CM

## 2018-09-14 DIAGNOSIS — H903 Sensorineural hearing loss, bilateral: Secondary | ICD-10-CM

## 2018-10-09 ENCOUNTER — Other Ambulatory Visit: Payer: Self-pay | Admitting: Cardiology

## 2018-10-29 ENCOUNTER — Other Ambulatory Visit: Payer: Self-pay | Admitting: Internal Medicine

## 2018-11-02 ENCOUNTER — Other Ambulatory Visit: Payer: Self-pay | Admitting: Internal Medicine

## 2018-11-06 ENCOUNTER — Other Ambulatory Visit: Payer: Self-pay

## 2018-11-06 ENCOUNTER — Ambulatory Visit: Payer: Medicare Other | Admitting: Adult Health

## 2018-11-06 ENCOUNTER — Encounter: Payer: Self-pay | Admitting: Adult Health

## 2018-11-06 ENCOUNTER — Ambulatory Visit: Payer: Medicare Other | Admitting: Internal Medicine

## 2018-11-06 DIAGNOSIS — J45909 Unspecified asthma, uncomplicated: Secondary | ICD-10-CM | POA: Insufficient documentation

## 2018-11-06 DIAGNOSIS — J4530 Mild persistent asthma, uncomplicated: Secondary | ICD-10-CM

## 2018-11-06 DIAGNOSIS — J9611 Chronic respiratory failure with hypoxia: Secondary | ICD-10-CM

## 2018-11-06 DIAGNOSIS — Z23 Encounter for immunization: Secondary | ICD-10-CM

## 2018-11-06 NOTE — Progress Notes (Signed)
_0  ID: Belinda [name], female    DOB: 1925-03-16, 85 y.o.   MRN: [id number]  Chief Complaint  Patient presents with  . Follow-up    Asthma     Referring provider: Monico Blitz, MD  HPI: 85  yo female with minimal smoking hx seen for pulmonary consult 07/28/18 for dyspnea and wheezing found to have Asthma  PMH significant for CAD s/p DES to LAD   TEST/EVENTS :  Spirometry 08/2017  shows normal lung function with FEV1 at 92%, ratio 71, FVC 92.   11/06/2018 Follow up: Asthma  , O2 RF -Oxygen At bedtime   Patient returns for a 31-monthfollow-up.  Patient has mild persistent asthma.  Gets short of breath with heavy activity.  She remains on Brovana and budesonide nebulizer twice daily.  She is on home oxygen at 2 L at bedtime.  Says she has been doing fairly well.  Energy level is not great. Grandson lives with her. By herself alone a lot . Does well. Light housework .  Great appetite. Light cooking.  Has some intermittent wheezing that comes and goes.  Is manageable.  Has rare use of albuterol   No Known Allergies  Immunization History  Administered Date(s) Administered  . Fluad Quad(high Dose 65+) 11/06/2018  . Influenza Inj Mdck Quad With Preservative 12/09/2017  . Influenza,inj,Quad PF,6+ Mos 12/09/2014  . Influenza,inj,quad, With Preservative 11/30/2016    Past Medical History:  Diagnosis Date  . Borderline diabetes   . Coronary atherosclerosis of native coronary artery    DES LAD and circumflex 1/09, LVEF 70%  . Essential hypertension, benign   . GERD (gastroesophageal reflux disease)   . Glaucoma   . Mixed hyperlipidemia   . Overactive bladder     Tobacco History: Social History   Tobacco Use  Smoking Status Former Smoker  . Packs/day: 0.10  . Years: 2.00  . Pack years: 0.20  . Types: Cigarettes  . Quit date: 02/08/1946  . Years since quitting: 72.7  Smokeless Tobacco Never Used   Counseling given: Not Answered   Outpatient Medications Prior to  Visit  Medication Sig Dispense Refill  . acetaminophen (TYLENOL) 325 MG tablet Per bottle as needed    . amLODipine (NORVASC) 5 MG tablet TAKE 1 TABLET BY MOUTH DAILY 30 tablet 6  . arformoterol (BROVANA) 15 MCG/2ML NEBU INHALE THE CONTENTS OF ONE VIAL TWICE DAILY 120 mL 6  . aspirin EC 81 MG tablet Take 1 tablet (81 mg total) by mouth daily. 90 tablet 3  . bisoprolol (ZEBETA) 5 MG tablet TAKE 1 TABLET BY MOUTH DAILY 30 tablet 11  . budesonide ([name]) 0.25 MG/2ML nebulizer solution INHALE THE CONTENTS OF ONE VIAL TWICE DAILY 120 mL 12  . dorzolamide-timolol (COSOPT) 22.3-6.8 MG/ML ophthalmic solution Place 1 drop into both eyes 2 (two) times daily.    . isosorbide mononitrate (IMDUR) 30 MG 24 hr tablet Take 1 tablet (30 mg total) by mouth daily. 30 tablet 1  . latanoprost (XALATAN) 0.005 % ophthalmic solution Place 1 drop into both eyes at bedtime.    .Marland Kitchenlevothyroxine (SYNTHROID, LEVOTHROID) 25 MCG tablet Take 25 mcg by mouth daily before breakfast.    . meclizine (ANTIVERT) 25 MG tablet Take 1 tablet by mouth Twice daily as needed.    . Melatonin 3 MG TABS Take 1 tablet by mouth at bedtime as needed.    . Multiple Vitamins-Minerals (ICAPS AREDS 2) CAPS Take 1 tablet by mouth 2 (two) times daily.    .[name]  OXYGEN 2lpm with sleep and as needed during the day DME- [facility]    . pantoprazole (PROTONIX) 40 MG tablet TAKE 1 TABLET BY MOUTH TWICE DAILY 30-60 MINUTES BEFORE MEALS 60 tablet 2  . pravastatin (PRAVACHOL) 20 MG tablet 1/2 tab by mouth once daily    . PROAIR HFA 108 (90 Base) MCG/ACT inhaler inhale TWO puffs EVERY 4 HOURS AS NEEDED FOR wheezing OR SHORTNESS OF BREATH 8.5 g 2  . sertraline (ZOLOFT) 50 MG tablet Take 1 tablet by mouth daily.    . traMADol (ULTRAM) 50 MG tablet Take by mouth every 6 (six) hours as needed.    . Vitamin D, Ergocalciferol, (DRISDOL) 50000 UNITS CAPS capsule Take 50,000 Units by mouth every Wednesday.      No facility-administered medications prior to visit.       Review of Systems:   Constitutional:   No  weight loss, night sweats,  Fevers, chills, fatigue, or  lassitude.  HEENT:   No headaches,  Difficulty swallowing,  Tooth/dental problems, or  Sore throat,                No sneezing, itching, ear ache, nasal congestion, post nasal drip,   CV:  No chest pain,  Orthopnea, PND, swelling in lower extremities, anasarca, dizziness, palpitations, syncope.   GI  No heartburn, indigestion, abdominal pain, nausea, vomiting, diarrhea, change in bowel habits, loss of appetite, bloody stools.   Resp:   No chest wall deformity  Skin: no rash or lesions.  GU: no dysuria, change in color of urine, no urgency or frequency.  No flank pain, no hematuria   MS:  No joint pain or swelling.  No decreased range of motion.  No back pain.    Physical Exam  BP 132/64 (BP Location: Left Arm, Cuff Size: Normal)   Pulse 76   Temp 98.5 F (36.9 C) (Temporal)   Ht _0  (1.473 m)   Wt 158 lb (71.7 kg)   SpO2 94%   BMI 33.02 kg/m   GEN: A/Ox3; pleasant , NAD, elderly    HEENT:  [place]/AT,   , NOSE-clear, THROAT-clear, no lesions, no postnasal drip or exudate noted.   NECK:  Supple w/ fair ROM; no JVD; normal carotid impulses w/o bruits; no thyromegaly or nodules palpated; no lymphadenopathy.    RESP  Clear  P & A; w/o, wheezes/ rales/ or rhonchi. no accessory muscle use, no dullness to percussion  CARD:  RRR, no m/r/g, tr  peripheral edema, pulses intact, no cyanosis or clubbing.  GI:   Soft & nt; nml bowel sounds; no organomegaly or masses detected.   Musco: Warm bil, no deformities or joint swelling noted.   Neuro: alert, no focal deficits noted.    Skin: Warm, no lesions or rashes    Lab Results:  CBC    Component Value Date/Time   WBC 7.3 08/09/2018 1429   RBC 3.81 (L) 08/09/2018 1429   HGB 12.5 08/09/2018 1429   HCT 38.1 08/09/2018 1429   PLT 144.0 (L) 08/09/2018 1429   MCV 100.0 08/09/2018 1429   MCHC 32.9 08/09/2018 1429   RDW 13.6  08/09/2018 1429   LYMPHSABS 2.1 08/09/2018 1429   MONOABS 0.6 08/09/2018 1429   EOSABS 0.1 08/09/2018 1429   BASOSABS 0.1 08/09/2018 1429    BMET    Component Value Date/Time   NA 140 08/09/2018 1429   K 4.1 08/09/2018 1429   CL 104 08/09/2018 1429   CO2 27 08/09/2018 1429  GLUCOSE 92 08/09/2018 1429   BUN 19 08/09/2018 1429   CREATININE 1.10 08/09/2018 1429   CALCIUM 8.9 08/09/2018 1429   GFRNONAA 56 (L) 02/22/2007 0340   GFRAA  02/22/2007 0340    >60        The eGFR has been calculated using the MDRD equation. This calculation has not been validated in all clinical situations. eGFR's persistently <60 mL/min signify possible Chronic Kidney Disease.    BNP No results found for: BNP  ProBNP    Component Value Date/Time   PROBNP 122.0 (H) 08/09/2018 1429    Imaging: No results found.    No flowsheet data found.  No results found for: NITRICOXIDE      Assessment & Plan:   Asthma Mild persistent asthma currently under control.  Plan  Patient Instructions  Continue on current regimen .  Continue on Oxygen 2l/m At bedtime  .  Continue on Budesonide and Brovana Neb Twice daily   Flu shot today .  Follow up with Dr. Melvyn Novas or Nomar Broad NP in 4-6 months and As needed        Chronic respiratory failure with hypoxia ([place]) Continue on oxygen 2 L at bedtime     Rexene Edison, NP 11/06/2018

## 2018-11-06 NOTE — Assessment & Plan Note (Signed)
Continue on oxygen 2 L at bedtime 

## 2018-11-06 NOTE — Patient Instructions (Signed)
Continue on current regimen .  Continue on Oxygen 2l/m At bedtime  .  Continue on Budesonide and Brovana Neb Twice daily   Flu shot today .  Follow up with Dr. Melvyn Novas or Borghild Thaker NP in 4-6 months and As needed

## 2018-11-06 NOTE — Assessment & Plan Note (Signed)
Mild persistent asthma currently under control.  Plan  Patient Instructions  Continue on current regimen .  Continue on Oxygen 2l/m At bedtime  .  Continue on Budesonide and Brovana Neb Twice daily   Flu shot today .  Follow up with Dr. Melvyn Novas or Jamier Urbas NP in 4-6 months and As needed

## 2018-11-07 NOTE — Progress Notes (Signed)
Chart and office note reviewed in detail  > agree with a/p as outlined    

## 2018-11-27 ENCOUNTER — Other Ambulatory Visit: Payer: Self-pay | Admitting: Internal Medicine

## 2019-01-29 ENCOUNTER — Other Ambulatory Visit: Payer: Self-pay | Admitting: Internal Medicine

## 2019-04-09 ENCOUNTER — Other Ambulatory Visit: Payer: Self-pay

## 2019-04-09 ENCOUNTER — Ambulatory Visit (INDEPENDENT_AMBULATORY_CARE_PROVIDER_SITE_OTHER): Payer: Medicare PPO

## 2019-04-09 ENCOUNTER — Encounter: Payer: Self-pay | Admitting: Internal Medicine

## 2019-04-09 ENCOUNTER — Ambulatory Visit: Payer: Medicare Other | Admitting: Internal Medicine

## 2019-04-09 DIAGNOSIS — J9611 Chronic respiratory failure with hypoxia: Secondary | ICD-10-CM

## 2019-04-09 DIAGNOSIS — R0600 Dyspnea, unspecified: Secondary | ICD-10-CM

## 2019-04-09 DIAGNOSIS — J45901 Unspecified asthma with (acute) exacerbation: Secondary | ICD-10-CM

## 2019-04-09 DIAGNOSIS — R0609 Other forms of dyspnea: Secondary | ICD-10-CM

## 2019-04-09 DIAGNOSIS — J441 Chronic obstructive pulmonary disease with (acute) exacerbation: Secondary | ICD-10-CM | POA: Diagnosis not present

## 2019-04-09 DIAGNOSIS — J189 Pneumonia, unspecified organism: Secondary | ICD-10-CM | POA: Diagnosis not present

## 2019-04-09 MED ORDER — ALBUTEROL SULFATE (2.5 MG/3ML) 0.083% IN NEBU: 2.5000 mg | INHALATION_SOLUTION | RESPIRATORY_TRACT | 12 refills | Status: AC | PRN

## 2019-04-09 NOTE — Progress Notes (Signed)
Subjective:     Patient ID: Belinda [name], female   DOB: 1925/09/28,   MRN: [id number]   Brief patient profile:  86 yowf  Quit smoking age 86 with seasonal symptoms of coughing sneezing worse since onset breathing problem around 2009  > cardiac eval :  DES to LAD and Cx 2009 with preserved LV fxn as of 2014.    History of Present Illness  07/27/2017 1st [facility] office visit/ Belinda [name]   Chief Complaint  Patient presents with  . Pulmonary Consult    Referred by Dr. Sherril Croon for eval of COPD.  Pt c/o SOB for the past 5-6 yrs, worse for the past 2 years. She states that she gets SOB "when I move alot".  She has had cough for at least the past month- prod with yellow to clear sputum. She states she often gets "choked up"  wearing 02 hs x sev years and sleeps ok on one pillow but p stirring  Every day x years now starts with coughing/choking worse with eating also and sob with minimal activity. Did not improve on symbicort x  Several year Dr Juanetta Gosling changed to trelegy x 6 monts prior to OV   [facility]kins and Orson Aloe do not feel she has copd per Daughter  rec I strongly recommend your eye doctor consider an alternative to your timolol eyedrops as they can cause cough and wheeze Stop atenolol and replace bisoprolol 5 mg one daily (bag #1)  Stop trelegy and fish oil  Change pantoprazole to 40 mg Take 30- 60 min before your first and last meals of the day  GERD   Plan A = Automatic = Brovana 15 mcg twice daily along budesonide 0.25 mg (Bag #1)  Plan B = Backup (Bag #2)  Only use your albuterol as a rescue medication  For cough > take your cough syrup (bag # 2)  08/23/17 NP Med calendar provided and rec use it and return to ov with it in hand   10/24/2017  f/u ov/Belinda [name] re: asthma / no med calendar  Chief Complaint  Patient presents with  . Follow-up    Breathing has improved some and she is wheezing less. She has not used her albuterol inhaler.   Dyspnea:  Strength and energy > sob limited /  still do some [employer] shopping Cough: none Sleeping: 30 degrees with lots of lots pillows  SABA use: none 02: 2lpm hs   rec No change in medications GERD diet    06/28/2018  f/u ov/Belinda [name] re: asthma on brova/bid bid did not bring meds or med calendar Chief Complaint  Patient presents with  . Follow-up    Breathing is worse.   Dyspnea:  Was able to do [employer] walking s 02 some walking around house/yard but worse with wheezing on ex since x sev months / indolent but minimally progressive Cough: not much at all  Sleeping: pillows get her to  30 degrees HOB and sleeps fine with head raised  SABA use: maybe daily  02: just 2lpm hs - refuses to wear daytime per daughter  rec Protonix should be Take 30 min before your first and last meals of the day  Synthroid can be one hour before  02 should be 2lpm at bedtime with activity more than walking across the room    08/09/2018  f/u ov/Belinda [name] re: asthma/ chronic resp failure, did not bring meds or 02  Chief Complaint  Patient presents with  . Follow-up    Breathing is  about the same. She has been wheezing more. She is not using her albuterol inhaler.   Dyspnea:  Mailbox and back s stopping is her goal  Cough: not much, gets choked some with food/ already on  on ppi but not bid ac consistently  Sleeping: 30 degrees does ok  SABA use: none  02: 2lpm hs and not using at all daytime - "left it in car"  rec Protonix 40 mg Take 30- 60 min before your first and last meals of the day  Continue 02 2lpm at bedtime and only use daytime to keep sats over 90% if needed   11/06/2018 NP  rec Continue on current regimen .  Continue on Oxygen 2l/m At bedtime  .  Continue on Budesonide and Brovana Neb Twice daily    hosp with covid 19 pna over [place] > back to baseline   04/09/2019  f/u ov/Belinda [name] re: asthma with 02 dep hs but not during the day  Chief Complaint  Patient presents with  . Follow-up    Breathing is unchanged since the last visit. She  rarely uses her albuterol inhaler.   Dyspnea: end of driveway and back no change off brovana x one mon Cough: none  Sleeping: flat / one pillow  SABA use: rarely, no more off brovana  02: 2lpm hs and rarely daytime    No obvious day to day or daytime variability or assoc excess/ purulent sputum or mucus plugs or hemoptysis or cp or chest tightness, subjective wheeze or overt sinus or hb symptoms.   sleeping without nocturnal  or early am exacerbation  of respiratory  c/o's or need for noct saba. Also denies any obvious fluctuation of symptoms with weather or environmental changes or other aggravating or alleviating factors except as outlined above   No unusual exposure hx or h/o childhood pna/ asthma or knowledge of premature birth.  Current Allergies, Complete Past Medical History, Past Surgical History, Family History, and Social History were reviewed in [employer] record.  ROS  The following are not active complaints unless bolded Hoarseness, sore throat, dysphagia, dental problems, itching, sneezing,  nasal congestion or discharge of excess mucus or purulent secretions, ear ache,   fever, chills, sweats, unintended wt loss or wt gain, classically pleuritic or exertional cp,  orthopnea pnd or arm/hand swelling  or leg swelling, presyncope, palpitations, abdominal pain, anorexia, nausea, vomiting, diarrhea  or change in bowel habits or change in bladder habits, change in stools or change in urine, dysuria, hematuria,  rash, arthralgias, visual complaints, headache, numbness, weakness or ataxia or problems with walking= walks with cane or coordination,  change in mood or  memory.        Current Meds  Medication Sig  . acetaminophen (TYLENOL) 325 MG tablet Per bottle as needed  . acetaminophen (TYLENOL) 650 MG CR tablet Take 650 mg by mouth every 8 (eight) hours as needed for pain.  [name] amLODipine (NORVASC) 5 MG tablet TAKE 1 TABLET BY MOUTH DAILY  . arformoterol  (BROVANA) 15 MCG/2ML NEBU [place] out x one month   . Ascorbic Acid (VITAMIN C) 100 MG tablet Take 100 mg by mouth daily.  [name] aspirin EC 81 MG tablet Take 1 tablet (81 mg total) by mouth daily.  . bisoprolol (ZEBETA) 5 MG tablet TAKE 1 TABLET BY MOUTH DAILY  . budesonide ([name]) 0.25 MG/2ML nebulizer solution INHALE THE CONTENTS OF ONE VIAL TWICE DAILY  . dorzolamide-timolol (COSOPT) 22.3-6.8 MG/ML ophthalmic solution Place 1  drop into both eyes 2 (two) times daily.  . isosorbide mononitrate (IMDUR) 30 MG 24 hr tablet Take 1 tablet (30 mg total) by mouth daily.  [name] latanoprost (XALATAN) 0.005 % ophthalmic solution Place 1 drop into both eyes at bedtime.  [name] levothyroxine (SYNTHROID, LEVOTHROID) 25 MCG tablet Take 25 mcg by mouth daily before breakfast.  . meclizine (ANTIVERT) 25 MG tablet Take 1 tablet by mouth Twice daily as needed.  . Melatonin 3 MG TABS Take 1 tablet by mouth at bedtime as needed.  . Multiple Vitamins-Minerals (ICAPS AREDS 2) CAPS Take 1 tablet by mouth 2 (two) times daily.  . OXYGEN 2lpm with sleep and as needed during the day DME- [facility]  . pantoprazole (PROTONIX) 40 MG tablet TAKE 1 TABLET BY MOUTH TWICE DAILY 30-60 MINUTES BEFORE MEALS  . pravastatin (PRAVACHOL) 20 MG tablet 1/2 tab by mouth once daily  . PROAIR HFA 108 (90 Base) MCG/ACT inhaler inhale TWO puffs EVERY 4 HOURS AS NEEDED FOR wheezing OR SHORTNESS OF BREATH  . sertraline (ZOLOFT) 50 MG tablet Take 1 tablet by mouth daily.  . traMADol (ULTRAM) 50 MG tablet Take by mouth every 6 (six) hours as needed.  . Vitamin D, Ergocalciferol, (DRISDOL) 50000 UNITS CAPS capsule Take 50,000 Units by mouth every Wednesday.   . zinc gluconate 50 MG tablet Take 50 mg by mouth daily.                    Objective:   Physical Exam  Elderly pleasant wf nad   04/09/2019          156  08/09/2018          159  06/28/2018        162  10/24/2017       159   07/27/17 153 lb (69.4 kg)  12/10/16 159 lb 12.8 oz (72.5 kg)   04/20/16 156 lb 12.8 oz (71.1 kg)       Vital signs reviewed  04/09/2019  - Note at rest 02 sats  96% on RA     HEENT : pt wearing mask not removed for exam due to covid -19 concerns.    NECK :  without JVD/Nodes/TM/ nl carotid upstrokes bilaterally   LUNGS: no acc muscle use,  Nl contour chest which is clear to A and P bilaterally without cough on insp or exp maneuvers   CV:  RRR  no s3 or murmur or increase in P2, and no edema   ABD:  soft and nontender with nl inspiratory excursion in the supine position. No bruits or organomegaly appreciated, bowel sounds nl  MS:  Nl gait/ ext warm without deformities, calf tenderness, cyanosis or clubbing No obvious joint restrictions   SKIN: warm and dry without lesions    NEURO:  alert, approp, nl sensorium with  no motor or cerebellar deficits apparent.               CXR PA and Lateral:   04/09/2019 :    I personally reviewed images and   impression as follows:  Non-specific increase markings bases R > L s as dz                  Assessment:

## 2019-04-09 NOTE — Patient Instructions (Addendum)
Plan A = Automatic = Always=    pulmicort (budesonide) twice daily per nebulizer   Plan B = Backup (to supplement plan A, not to replace it) Only use your albuterol(RED/proair) inhaler as a rescue medication to be used if you can't catch your breath by resting or doing a relaxed purse lip breathing pattern.  - The less you use it, the better it will work when you need it. - Ok to use the inhaler up to 2 puffs  every 4 hours if you must but call for appointment if use goes up over your usual need - Don't leave home without it !!  (think of it like the spare tire for your car)   Plan C = Crisis (instead of Plan B but only if Plan B stops working) - only use your albuterol nebulizer if you first try Plan B and it fails to help > ok to use the nebulizer up to every 4 hours but if start needing it regularly call for immediate appointment   Plan D = Doctor - if having to use more albuterol, call for restart on the Brovana)   Make sure you check your oxygen saturations at highest level of activity to be sure it stays over 90% and adjust upward to maintain this level if needed but remember to turn it back to previous settings when you stop (to conserve your supply).   Please remember to go to the  x-ray department  for your tests - we will call you with the results when they are available    Please schedule a follow up visit in 6 months but call sooner if needed   -  [facility]

## 2019-04-10 ENCOUNTER — Encounter: Payer: Self-pay | Admitting: Internal Medicine

## 2019-04-10 NOTE — Assessment & Plan Note (Signed)
Onset ? Around 2009  - Allergy profile 07/27/17  >  Eos 0.1 /  IgE  4 RAST neg  - 04/09/2019 off brovana x one month with no increase doe or need for saba so left off it but continue on pulmicort 0.25 mg bid   All goals of chronic asthma control met including optimal function and elimination of symptoms with minimal need for rescue therapy.  Contingencies discussed in full including contacting this office immediately if not controlling the symptoms using the rule of two's.     If breaking rule of two's add back brovana          Each maintenance medication was reviewed in detail including emphasizing most importantly the difference between maintenance and prns and under what circumstances the prns are to be triggered using an action plan format where appropriate.  Total time for H and P, chart review, counseling, reviewing devices for asthma,  directly observing portions of ambulatory 02 saturation study/  and generating customized AVS unique to this office visit / charting = 30 min       

## 2019-04-10 NOTE — Assessment & Plan Note (Signed)
06/28/2018 Patient saturation on room air at rest = 94% >>>  while ambulating = 84% x 125 ft slow pace >  Placed on 2 liters of O2 while ambulating = 97% - 08/09/2018  Did not desaturate walking 250 ft though was sob  - 04/09/2019   Walked RA x one lap =  approx 250 ft -@ slow pace stopped due to 86%     As of 04/09/2019 rec 2lpm hs and prn daytime if sats dip < 90% with increased activity ie  than just walking room to room at home

## 2019-04-10 NOTE — Assessment & Plan Note (Signed)
See resp failure

## 2019-04-12 NOTE — Progress Notes (Signed)
Spoke with the pt's daughter, Jan and notified of results ok per [facility]

## 2019-04-25 DIAGNOSIS — Z299 Encounter for prophylactic measures, unspecified: Secondary | ICD-10-CM | POA: Diagnosis not present

## 2019-04-25 DIAGNOSIS — Z789 Other specified health status: Secondary | ICD-10-CM | POA: Diagnosis not present

## 2019-04-25 DIAGNOSIS — R197 Diarrhea, unspecified: Secondary | ICD-10-CM | POA: Diagnosis not present

## 2019-04-25 DIAGNOSIS — I10 Essential (primary) hypertension: Secondary | ICD-10-CM | POA: Diagnosis not present

## 2019-04-25 DIAGNOSIS — Z6831 Body mass index (BMI) 31.0-31.9, adult: Secondary | ICD-10-CM | POA: Diagnosis not present

## 2019-04-25 DIAGNOSIS — J449 Chronic obstructive pulmonary disease, unspecified: Secondary | ICD-10-CM | POA: Diagnosis not present

## 2019-05-04 ENCOUNTER — Other Ambulatory Visit: Payer: Self-pay | Admitting: Internal Medicine

## 2019-05-04 ENCOUNTER — Other Ambulatory Visit: Payer: Self-pay | Admitting: Cardiology

## 2019-05-17 DIAGNOSIS — H353112 Nonexudative age-related macular degeneration, right eye, intermediate dry stage: Secondary | ICD-10-CM | POA: Diagnosis not present

## 2019-06-01 ENCOUNTER — Other Ambulatory Visit: Payer: Self-pay | Admitting: Cardiology

## 2019-06-06 ENCOUNTER — Ambulatory Visit (INDEPENDENT_AMBULATORY_CARE_PROVIDER_SITE_OTHER): Payer: Medicare PPO | Admitting: Cardiology

## 2019-06-06 ENCOUNTER — Other Ambulatory Visit: Payer: Self-pay

## 2019-06-06 ENCOUNTER — Encounter: Payer: Self-pay | Admitting: Cardiology

## 2019-06-06 VITALS — BP 120/62 | HR 70 | Ht 59.0 in | Wt 153.8 lb

## 2019-06-06 DIAGNOSIS — I25119 Atherosclerotic heart disease of native coronary artery with unspecified angina pectoris: Secondary | ICD-10-CM | POA: Diagnosis not present

## 2019-06-06 DIAGNOSIS — E782 Mixed hyperlipidemia: Secondary | ICD-10-CM | POA: Diagnosis not present

## 2019-06-06 NOTE — Addendum Note (Signed)
Addended by: Eustace Moore on: 06/06/2019 04:14 PM   Modules accepted: Orders

## 2019-06-06 NOTE — Patient Instructions (Addendum)
Medication Instructions:    Your physician recommends that you continue on your current medications as directed. Please refer to the Current Medication list given to you today.  Labwork:  NONE  Testing/Procedures:  NONE  Follow-Up:  Your physician recommends that you schedule a follow-up appointment in: 6 months (office)  Any Other Special Instructions Will Be Listed Below (If Applicable).  If you need a refill on your cardiac medications before your next appointment, please call your pharmacy. 

## 2019-06-06 NOTE — Progress Notes (Signed)
Cardiology Office Note  Date: 06/06/2019   ID: Belinda [name], DOB April 05, 1925, MRN [id number]  PCP:  Monico Blitz, MD  Cardiologist:  Rozann Lesches, MD Electrophysiologist:  None   Chief Complaint  Patient presents with  . Cardiac follow-up    History of Present Illness: Belinda [name] is a 86 y.o. female last seen in March 2020.  She is here today with her daughter for a follow-up visit.  Still functional with basic ADLs but has dyspnea on exertion and fatigue as before.  It does not sound like she uses her oxygen regularly during the daytime, but consistently uses it at nighttime.  She does not describe any angina, no palpitations or syncope.  Follow-up noted with Dr. Melvyn Novas in March.  I reviewed the note.  I reviewed her cardiac medications which are listed below.  I personally reviewed her ECG today which shows sinus rhythm.  She has had both doses of the coronavirus vaccine.  Past Medical History:  Diagnosis Date  . Borderline diabetes   . Coronary atherosclerosis of native coronary artery    DES LAD and circumflex 1/09, LVEF 70%  . Essential hypertension   . GERD (gastroesophageal reflux disease)   . Glaucoma   . Mixed hyperlipidemia   . Overactive bladder     Past Surgical History:  Procedure Laterality Date  . APPENDECTOMY    . BACK SURGERY    . CHOLECYSTECTOMY    . COLONOSCOPY  07/15/2011   Procedure: COLONOSCOPY;  Surgeon: Rogene Houston, MD;  Location: [facility];  Service: Endoscopy;  Laterality: N/A;  930  . HERNIA REPAIR    . TONSILLECTOMY      Current Outpatient Medications  Medication Sig Dispense Refill  . acetaminophen (TYLENOL) 325 MG tablet Per bottle as needed    . acetaminophen (TYLENOL) 650 MG CR tablet Take 650 mg by mouth every 8 (eight) hours as needed for pain.    [name] albuterol (PROVENTIL) (2.5 MG/3ML) 0.083% nebulizer solution Take 3 mLs (2.5 mg total) by nebulization every 4 (four) hours as needed for wheezing or shortness of breath.  75 mL 12  . amLODipine (NORVASC) 5 MG tablet TAKE 1 TABLET BY MOUTH DAILY 30 tablet 0  . Ascorbic Acid (VITAMIN C) 100 MG tablet Take 100 mg by mouth daily.    [name] aspirin EC 81 MG tablet Take 1 tablet (81 mg total) by mouth daily. 90 tablet 3  . bisoprolol (ZEBETA) 5 MG tablet TAKE 1 TABLET BY MOUTH DAILY 30 tablet 11  . budesonide ([name]) 0.25 MG/2ML nebulizer solution INHALE THE CONTENTS OF ONE VIAL TWICE DAILY 120 mL 12  . dorzolamide-timolol (COSOPT) 22.3-6.8 MG/ML ophthalmic solution Place 1 drop into both eyes 2 (two) times daily.    . isosorbide mononitrate (IMDUR) 30 MG 24 hr tablet Take 1 tablet (30 mg total) by mouth daily. 30 tablet 1  . latanoprost (XALATAN) 0.005 % ophthalmic solution Place 1 drop into both eyes at bedtime.    [name] levothyroxine (SYNTHROID) 50 MCG tablet Take 1 tablet by mouth daily.    . meclizine (ANTIVERT) 25 MG tablet Take 1 tablet by mouth Twice daily as needed.    . Melatonin 3 MG TABS Take 1 tablet by mouth at bedtime as needed.    . Multiple Vitamins-Minerals (ICAPS AREDS 2) CAPS Take 1 tablet by mouth 2 (two) times daily.    . OXYGEN 2lpm with sleep and as needed during the day DME- [facility]    .  pantoprazole (PROTONIX) 40 MG tablet TAKE 1 TABLET BY MOUTH TWICE DAILY 30-60 MINUTES BEFORE MEALS 60 tablet 12  . pravastatin (PRAVACHOL) 20 MG tablet 1/2 tab by mouth once daily    . PROAIR HFA 108 (90 Base) MCG/ACT inhaler inhale TWO puffs EVERY 4 HOURS AS NEEDED FOR wheezing OR SHORTNESS OF BREATH 8.5 g 2  . sertraline (ZOLOFT) 50 MG tablet Take 1 tablet by mouth daily.    . traMADol (ULTRAM) 50 MG tablet Take by mouth every 6 (six) hours as needed.    . Vitamin D, Ergocalciferol, (DRISDOL) 50000 UNITS CAPS capsule Take 50,000 Units by mouth every Wednesday.     . zinc gluconate 50 MG tablet Take 50 mg by mouth daily.     No current facility-administered medications for this visit.   Allergies:  Patient has no known allergies.   ROS:  No  syncope.  Physical Exam: VS:  BP 120/62   Pulse 70   Ht _0  (1.499 m)   Wt 153 lb 12.8 oz (69.8 kg)   SpO2 96%   BMI 31.06 kg/m , BMI Body mass index is 31.06 kg/m.  Wt Readings from Last 3 Encounters:  06/06/19 153 lb 12.8 oz (69.8 kg)  04/09/19 156 lb (70.8 kg)  11/06/18 158 lb (71.7 kg)    General:  Elderly woman, appears comfortable at rest. HEENT: Conjunctiva and lids normal, wearing a mask. Neck: Supple, no elevated JVP or carotid bruits, no thyromegaly. Lungs: Decreased breath sounds without wheezing, nonlabored breathing at rest. Cardiac: Regular rate and rhythm, no S3 or significant systolic murmur, no pericardial rub. Extremities: Trace ankle edema, distal pulses 2+.  ECG:  An ECG dated 04/17/2018 was personally reviewed today and demonstrated:  Sinus rhythm with decreased anteroseptal R wave progression, lead motion artifact.  Recent Labwork: 08/09/2018: BUN 19; Creatinine, Ser 1.10; Hemoglobin 12.5; Platelets 144.0; Potassium 4.1; Pro B Natriuretic peptide (BNP) 122.0; Sodium 140; TSH 5.35   Other Studies Reviewed Today:  Echocardiogram 10/30/2012 [facility]): LVEF 03-50% with diastolic dysfunction, Mac with trace mitral regurgitation, mildly sclerotic aortic valve, mild tricuspid regurgitation with normal RVSP, mildly dilated right ventricle.  Assessment and Plan:  1.  CAD status post DES to the LAD and circumflex in 2009.  She reports no definite angina and her ECG is stable today.  Continue aspirin, Norvasc, Zebeta, Imdur, and Pravachol.  2.  Mixed hyperlipidemia, continue to follow with Dr. Manuella Ghazi.  She is tolerating Pravachol.  3.  Asthma with chronic hypoxic respiratory failure.  She continues to follow with Dr. Melvyn Novas.  Medication Adjustments/Labs and Tests Ordered: Current medicines are reviewed at length with the patient today.  Concerns regarding medicines are outlined above.   Tests Ordered: No orders of the defined types were placed in this  encounter.   Medication Changes: No orders of the defined types were placed in this encounter.   Disposition:  Follow up 6 months in the [place] office.  Signed, Satira Sark, MD, [facility] 06/06/2019 3:57 PM    [facility] [place], [place], [place] [address] Phone: [phone]; Fax: [phone]

## 2019-06-07 DIAGNOSIS — K219 Gastro-esophageal reflux disease without esophagitis: Secondary | ICD-10-CM | POA: Diagnosis not present

## 2019-06-07 DIAGNOSIS — Z6830 Body mass index (BMI) 30.0-30.9, adult: Secondary | ICD-10-CM | POA: Diagnosis not present

## 2019-06-07 DIAGNOSIS — M1990 Unspecified osteoarthritis, unspecified site: Secondary | ICD-10-CM | POA: Diagnosis not present

## 2019-06-07 DIAGNOSIS — H409 Unspecified glaucoma: Secondary | ICD-10-CM | POA: Diagnosis not present

## 2019-06-07 DIAGNOSIS — Z823 Family history of stroke: Secondary | ICD-10-CM | POA: Diagnosis not present

## 2019-06-07 DIAGNOSIS — E039 Hypothyroidism, unspecified: Secondary | ICD-10-CM | POA: Diagnosis not present

## 2019-06-07 DIAGNOSIS — I739 Peripheral vascular disease, unspecified: Secondary | ICD-10-CM | POA: Diagnosis not present

## 2019-06-07 DIAGNOSIS — H547 Unspecified visual loss: Secondary | ICD-10-CM | POA: Diagnosis not present

## 2019-06-07 DIAGNOSIS — R32 Unspecified urinary incontinence: Secondary | ICD-10-CM | POA: Diagnosis not present

## 2019-06-07 DIAGNOSIS — J449 Chronic obstructive pulmonary disease, unspecified: Secondary | ICD-10-CM | POA: Diagnosis not present

## 2019-06-07 DIAGNOSIS — G8929 Other chronic pain: Secondary | ICD-10-CM | POA: Diagnosis not present

## 2019-06-07 DIAGNOSIS — Z8249 Family history of ischemic heart disease and other diseases of the circulatory system: Secondary | ICD-10-CM | POA: Diagnosis not present

## 2019-06-07 DIAGNOSIS — E669 Obesity, unspecified: Secondary | ICD-10-CM | POA: Diagnosis not present

## 2019-06-07 DIAGNOSIS — Z7982 Long term (current) use of aspirin: Secondary | ICD-10-CM | POA: Diagnosis not present

## 2019-06-07 DIAGNOSIS — Z7722 Contact with and (suspected) exposure to environmental tobacco smoke (acute) (chronic): Secondary | ICD-10-CM | POA: Diagnosis not present

## 2019-06-07 DIAGNOSIS — E785 Hyperlipidemia, unspecified: Secondary | ICD-10-CM | POA: Diagnosis not present

## 2019-06-07 DIAGNOSIS — R0902 Hypoxemia: Secondary | ICD-10-CM | POA: Diagnosis not present

## 2019-06-07 DIAGNOSIS — I10 Essential (primary) hypertension: Secondary | ICD-10-CM | POA: Diagnosis not present

## 2019-06-21 ENCOUNTER — Other Ambulatory Visit: Payer: Self-pay | Admitting: Internal Medicine

## 2019-07-26 DIAGNOSIS — Z6831 Body mass index (BMI) 31.0-31.9, adult: Secondary | ICD-10-CM | POA: Diagnosis not present

## 2019-07-26 DIAGNOSIS — Z299 Encounter for prophylactic measures, unspecified: Secondary | ICD-10-CM | POA: Diagnosis not present

## 2019-07-26 DIAGNOSIS — I25119 Atherosclerotic heart disease of native coronary artery with unspecified angina pectoris: Secondary | ICD-10-CM | POA: Diagnosis not present

## 2019-07-26 DIAGNOSIS — Z789 Other specified health status: Secondary | ICD-10-CM | POA: Diagnosis not present

## 2019-07-26 DIAGNOSIS — K580 Irritable bowel syndrome with diarrhea: Secondary | ICD-10-CM | POA: Diagnosis not present

## 2019-07-26 DIAGNOSIS — I10 Essential (primary) hypertension: Secondary | ICD-10-CM | POA: Diagnosis not present

## 2019-07-26 DIAGNOSIS — J449 Chronic obstructive pulmonary disease, unspecified: Secondary | ICD-10-CM | POA: Diagnosis not present

## 2019-08-02 ENCOUNTER — Other Ambulatory Visit: Payer: Self-pay | Admitting: Cardiology

## 2019-08-29 DIAGNOSIS — K580 Irritable bowel syndrome with diarrhea: Secondary | ICD-10-CM | POA: Diagnosis not present

## 2019-08-29 DIAGNOSIS — I10 Essential (primary) hypertension: Secondary | ICD-10-CM | POA: Diagnosis not present

## 2019-08-29 DIAGNOSIS — R197 Diarrhea, unspecified: Secondary | ICD-10-CM | POA: Diagnosis not present

## 2019-08-29 DIAGNOSIS — Z299 Encounter for prophylactic measures, unspecified: Secondary | ICD-10-CM | POA: Diagnosis not present

## 2019-08-29 DIAGNOSIS — J449 Chronic obstructive pulmonary disease, unspecified: Secondary | ICD-10-CM | POA: Diagnosis not present

## 2019-08-29 DIAGNOSIS — Z6831 Body mass index (BMI) 31.0-31.9, adult: Secondary | ICD-10-CM | POA: Diagnosis not present

## 2019-09-04 ENCOUNTER — Other Ambulatory Visit: Payer: Self-pay | Admitting: Internal Medicine

## 2019-09-25 DIAGNOSIS — H401132 Primary open-angle glaucoma, bilateral, moderate stage: Secondary | ICD-10-CM | POA: Diagnosis not present

## 2019-10-09 DIAGNOSIS — K580 Irritable bowel syndrome with diarrhea: Secondary | ICD-10-CM | POA: Diagnosis not present

## 2019-10-09 DIAGNOSIS — I10 Essential (primary) hypertension: Secondary | ICD-10-CM | POA: Diagnosis not present

## 2019-10-09 DIAGNOSIS — Z789 Other specified health status: Secondary | ICD-10-CM | POA: Diagnosis not present

## 2019-10-09 DIAGNOSIS — Z299 Encounter for prophylactic measures, unspecified: Secondary | ICD-10-CM | POA: Diagnosis not present

## 2019-10-10 ENCOUNTER — Ambulatory Visit: Payer: Medicare PPO | Admitting: Internal Medicine

## 2019-10-19 ENCOUNTER — Telehealth: Payer: Self-pay | Admitting: *Deleted

## 2019-10-19 NOTE — Telephone Encounter (Signed)
PA initiated for Brovana nebulizer solution.  Sent in via CMM>

## 2019-10-23 NOTE — Telephone Encounter (Signed)
The brovanna has been covered under medicare part B.

## 2019-11-02 ENCOUNTER — Encounter: Payer: Self-pay | Admitting: Internal Medicine

## 2019-11-02 ENCOUNTER — Other Ambulatory Visit: Payer: Self-pay

## 2019-11-02 ENCOUNTER — Ambulatory Visit: Payer: Medicare PPO | Admitting: Internal Medicine

## 2019-11-02 DIAGNOSIS — J45901 Unspecified asthma with (acute) exacerbation: Secondary | ICD-10-CM

## 2019-11-02 DIAGNOSIS — J441 Chronic obstructive pulmonary disease with (acute) exacerbation: Secondary | ICD-10-CM

## 2019-11-02 DIAGNOSIS — J9611 Chronic respiratory failure with hypoxia: Secondary | ICD-10-CM | POA: Diagnosis not present

## 2019-11-02 NOTE — Patient Instructions (Addendum)
Make sure you check your oxygen saturations at highest level of activity to be sure it stays over 90% and adjust upward to maintain this level if needed but remember to turn it back to previous settings when you stop (to conserve your supply).   No change in medications   Please schedule a follow up visit in 12 months but call sooner if needed

## 2019-11-02 NOTE — Progress Notes (Signed)
Subjective:     Patient ID: Belinda [name], female   DOB: 06-22-1925,   MRN: [id number]  Brief patient profile:  86 yowf  Quit smoking age 86 with seasonal symptoms of coughing sneezing worse since onset breathing problem around 2009  > cardiac eval :  DES to LAD and Cx 2009 with preserved LV fxn as of 2014.    History of Present Illness  07/27/2017 1st [facility] office visit/ Belinda [name]   Chief Complaint  Patient presents with  . Pulmonary Consult    Referred by Dr. Sherril Croon for eval of COPD.  Pt c/o SOB for the past 5-6 yrs, worse for the past 2 years. She states that she gets SOB "when I move alot".  She has had cough for at least the past month- prod with yellow to clear sputum. She states she often gets "choked up"  wearing 02 hs x sev years and sleeps ok on one pillow but p stirring  Every day x years now starts with coughing/choking worse with eating also and sob with minimal activity. Did not improve on symbicort x  Several year Dr Juanetta Gosling changed to trelegy x 6 monts prior to OV   [facility] and Orson Aloe do not feel she has copd per Daughter  rec I strongly recommend your eye doctor consider an alternative to your timolol eyedrops as they can cause cough and wheeze Stop atenolol and replace bisoprolol 5 mg one daily (bag #1)  Stop trelegy and fish oil  Change pantoprazole to 40 mg Take 30- 60 min before your first and last meals of the day  GERD   Plan A = Automatic = Brovana 15 mcg twice daily along budesonide 0.25 mg (Bag #1)  Plan B = Backup (Bag #2)  Only use your albuterol as a rescue medication  For cough > take your cough syrup (bag # 2)  08/23/17 NP Med calendar provided and rec use it and return to ov with it in hand   10/24/2017  f/u ov/Belinda [name] re: asthma / no med calendar  Chief Complaint  Patient presents with  . Follow-up    Breathing has improved some and she is wheezing less. She has not used her albuterol inhaler.   Dyspnea:  Strength and energy > sob limited /  still do some [employer] shopping Cough: none Sleeping: 30 degrees with lots of lots pillows  SABA use: none 02: 2lpm hs   rec No change in medications GERD diet    06/28/2018  f/u ov/Belinda [name] re: asthma on brova/bid bid did not bring meds or med calendar Chief Complaint  Patient presents with  . Follow-up    Breathing is worse.   Dyspnea:  Was able to do [employer] walking s 02 some walking around house/yard but worse with wheezing on ex since x sev months / indolent but minimally progressive Cough: not much at all  Sleeping: pillows get her to  30 degrees HOB and sleeps fine with head raised  SABA use: maybe daily  02: just 2lpm hs - refuses to wear daytime per daughter  rec Protonix should be Take 30 min before your first and last meals of the day  Synthroid can be one hour before  02 should be 2lpm at bedtime with activity more than walking across the room    08/09/2018  f/u ov/Belinda [name] re: asthma/ chronic resp failure, did not bring meds or 02  Chief Complaint  Patient presents with  . Follow-up    Breathing is about  the same. She has been wheezing more. She is not using her albuterol inhaler.   Dyspnea:  Mailbox and back s stopping is her goal  Cough: not much, gets choked some with food/ already on  on ppi but not bid ac consistently  Sleeping: 30 degrees does ok  SABA use: none  02: 2lpm hs and not using at all daytime - "left it in car"  rec Protonix 40 mg Take 30- 60 min before your first and last meals of the day  Continue 02 2lpm at bedtime and only use daytime to keep sats over 90% if needed   11/06/2018 NP  rec Continue on current regimen .  Continue on Oxygen 2l/m At bedtime  .  Continue on Budesonide and Brovana Neb Twice daily    hosp with covid 19 pna over [place] > back to baseline   04/09/2019  f/u ov/Byrant Valent re: asthma with 02 dep hs but not during the day  Chief Complaint  Patient presents with  . Follow-up    Breathing is unchanged since the last visit. She  rarely uses her albuterol inhaler.   Dyspnea: end of driveway and back no change off brovana x one month Cough: none  Sleeping: flat / one pillow  SABA use: rarely, no more off brovana  02: 2lpm hs and rarely daytime  rec Plan A = Automatic = Always=    pulmicort (budesonide) twice daily per nebulizer  Plan B = Backup (to supplement plan A, not to replace it) Only use your albuterol(RED/proair) inhaler as a rescue medication to be used if you can't catch your breath by resting or doing a relaxed purse lip breathing pattern.  - The less you use it, the better it will work when you need it. - Ok to use the inhaler up to 2 puffs  every 4 hours if you must but call for appointment if use goes up over your usual need - Don't leave home without it !!  (think of it like the spare tire for your car)  Plan C = Crisis (instead of Plan B but only if Plan B stops working) - only use your albuterol nebulizer if you first try Plan B and it fails to help > ok to use the nebulizer up to every 4 hours but if start needing it regularly call for immediate appointment Plan D = Doctor - if having to use more albuterol, call for restart on the Brovana)  Make sure you check your oxygen saturations at highest level of activity to be sure it stays over 90% and adjust upward to maintain this level if needed but remember to turn it back to previous settings when you stop (to conserve your supply).  Please remember to go to the  x-ray department  for your tests - we will call you with the results when they are available   Please schedule a follow up visit in 6 months but call sooner if needed   -  [place] office   11/02/2019  f/u ov/Belinda [name] re: asthma just on budesonide bid Chief Complaint  Patient presents with  . Follow-up    shortness of breath with exertion  Dyspnea:  Less active due to balance with breathing not limiting  Cough: not much  At all  Sleeping: bed is flat / one pillow  SABA use:  proair maybe once a week/ never neb saba 02: 2 hs / not much during the day    No obvious day  to day or daytime variability or assoc excess/ purulent sputum or mucus plugs or hemoptysis or cp or chest tightness, subjective wheeze or overt sinus or hb symptoms.   Sleeping as above without nocturnal  or early am exacerbation  of respiratory  c/o's or need for noct saba. Also denies any obvious fluctuation of symptoms with weather or environmental changes or other aggravating or alleviating factors except as outlined above   No unusual exposure hx or h/o childhood pna/ asthma or knowledge of premature birth.  Current Allergies, Complete Past Medical History, Past Surgical History, Family History, and Social History were reviewed in [facility] electronic medical record.  ROS  The following are not active complaints unless bolded Hoarseness, sore throat, dysphagia, dental problems, itching, sneezing,  nasal congestion or discharge of excess mucus or purulent secretions, ear ache,   fever, chills, sweats, unintended wt loss or wt gain, classically pleuritic or exertional cp,  orthopnea pnd or arm/hand swelling  or leg swelling, presyncope, palpitations, abdominal pain, anorexia, nausea, vomiting, diarrhea  or change in bowel habits or change in bladder habits, change in stools or change in urine, dysuria, hematuria,  rash, arthralgias, visual complaints, headache, numbness, weakness or ataxia or problems with walking or coordination,  change in mood or  Memory. Very hard of hearing and won't wear her hearing aides "they are in my purse"         Current Meds  Medication Sig  . acetaminophen (TYLENOL) 325 MG tablet Per bottle as needed  . acetaminophen (TYLENOL) 650 MG CR tablet Take 650 mg by mouth every 8 (eight) hours as needed for pain.  [name]. albuterol (PROVENTIL) (2.5 MG/3ML) 0.083% nebulizer solution Take 3 mLs (2.5 mg total) by nebulization every 4 (four) hours as needed for wheezing or  shortness of breath.  [name]. amLODipine (NORVASC) 5 MG tablet TAKE 1 TABLET BY MOUTH DAILY  . Ascorbic Acid (VITAMIN C) 100 MG tablet Take 100 mg by mouth daily.  [name]. aspirin EC 81 MG tablet Take 1 tablet (81 mg total) by mouth daily.  . bisoprolol (ZEBETA) 5 MG tablet TAKE 1 TABLET BY MOUTH DAILY  . budesonide ([name]) 0.25 MG/2ML nebulizer solution INHALE THE CONTENTS OF ONE VIAL TWICE DAILY  . dorzolamide-timolol (COSOPT) 22.3-6.8 MG/ML ophthalmic solution Place 1 drop into both eyes 2 (two) times daily.  [name]. escitalopram (LEXAPRO) 10 MG tablet   . isosorbide mononitrate (IMDUR) 30 MG 24 hr tablet Take 1 tablet (30 mg total) by mouth daily.  [name]. latanoprost (XALATAN) 0.005 % ophthalmic solution Place 1 drop into both eyes at bedtime.  [name]. levothyroxine (SYNTHROID) 50 MCG tablet Take 1 tablet by mouth daily.  . meclizine (ANTIVERT) 25 MG tablet Take 1 tablet by mouth Twice daily as needed.  . Melatonin 3 MG TABS Take 1 tablet by mouth at bedtime as needed.  . Multiple Vitamins-Minerals (ICAPS AREDS 2) CAPS Take 1 tablet by mouth 2 (two) times daily.  . OXYGEN 2lpm with sleep and as needed during the day DME- [facility]  . pantoprazole (PROTONIX) 40 MG tablet TAKE 1 TABLET BY MOUTH TWICE DAILY 30-60 MINUTES BEFORE MEALS  . pravastatin (PRAVACHOL) 20 MG tablet 1/2 tab by mouth once daily  . PROAIR HFA 108 (90 Base) MCG/ACT inhaler inhale TWO puffs EVERY 4 HOURS AS NEEDED FOR wheezing OR SHORTNESS OF BREATH  . traMADol (ULTRAM) 50 MG tablet Take by mouth every 6 (six) hours as needed.  . Vitamin D, Ergocalciferol, (DRISDOL) 50000 UNITS CAPS capsule Take  50,000 Units by mouth every Wednesday.   . zinc gluconate 50 MG tablet Take 50 mg by mouth daily.  . [DISCONTINUED] sertraline (ZOLOFT) 50 MG tablet Take 1 tablet by mouth daily.                       Objective:   Physical Exam    11/02/2019        152  04/09/2019          156  08/09/2018          159  06/28/2018        162  10/24/2017       159    07/27/17 153 lb (69.4 kg)  12/10/16 159 lb 12.8 oz (72.5 kg)  04/20/16 156 lb 12.8 oz (71.1 kg)     pleasant very elderly appearing amb wf walks with cane  Vital signs reviewed  11/02/2019  - Note at rest 02 sats  94% on RA     HEENT : pt wearing mask not removed for exam due to covid -19 concerns.    NECK :  without JVD/Nodes/TM/ nl carotid upstrokes bilaterally   LUNGS: no acc muscle use,  Mod kyphotic contour chest which is clear to A and P bilaterally without cough on insp or exp maneuvers   CV:  RRR  no s3 or murmur or increase in P2, and no edema   ABD:  soft and nontender with nl inspiratory excursion in the supine position. No bruits or organomegaly appreciated, bowel sounds nl  MS:   xt warm without deformities, calf tenderness, cyanosis or clubbing No obvious joint restrictions   SKIN: warm and dry without lesions    NEURO:  alert, approp, nl sensorium with  no obvious motor deficits apparent.         Assessment:

## 2019-11-03 ENCOUNTER — Encounter: Payer: Self-pay | Admitting: Internal Medicine

## 2019-11-03 NOTE — Assessment & Plan Note (Signed)
06/28/2018 Patient saturation on room air at rest = 94% >>>  while ambulating = 84% x 125 ft slow pace >  Placed on 2 liters of O2 while ambulating = 97% - 08/09/2018  Did not desaturate walking 250 ft though was sob  - 04/09/2019   Walked RA x one lap =  approx 250 ft -@ slow pace stopped due to 86%     As of 11/02/2019 rec 2lpm hs and prn daytime if sats dip < 90% with activity  Advised: Make sure you check your oxygen saturations at highest level of activity to be sure it stays over 90% and adjust  02 flow upward to maintain this level if needed but remember to turn it back to previous settings when you stop (to conserve your supply).    F/u yearly at this point, call sooner if needed

## 2019-11-03 NOTE — Assessment & Plan Note (Addendum)
Onset ? Around 2009  - Allergy profile 07/27/17  >  Eos 0.1 /  IgE  4 RAST neg  - spirometry 08/23/17 nl x mild concavity to f/v loop  - 04/09/2019 off brovana x one month with no increase doe or need for saba so left off it but continue on pulmicort 0.25 mg bid  > free of limiting symptoms 11/02/2019    All goals of chronic asthma control met including optimal function and elimination of symptoms with minimal need for rescue therapy.  Contingencies discussed in full including contacting this office immediately if not controlling the symptoms using the rule of two's.     Re saba: I spent extra time with pt today reviewing appropriate use of albuterol for prn use on exertion with the following points: 1) saba is for relief of sob that does not improve by walking a slower pace or resting but rather if the pt does not improve after trying this first. 2) If the pt is convinced, as many are, that saba helps recover from activity faster then it's easy to tell if this is the case by re-challenging : ie stop, take the inhaler, then p 5 minutes try the exact same activity (intensity of workload) that just caused the symptoms and see if they are substantially diminished or not after saba 3) if there is an activity that reproducibly causes the symptoms, try the saba 15 min before the activity on alternate days   If in fact the saba really does help, then fine to continue to use it prn but advised may need to look closer at the maintenance regimen being used to achieve better control of airways disease with exertion.          Each maintenance medication was reviewed in detail including emphasizing most importantly the difference between maintenance and prns and under what circumstances the prns are to be triggered using an action plan format where appropriate.  Total time for H and P, chart review, counseling, reviewing nebs/inhalers/02 devices and generating customized AVS unique to this office visit /  charting = 20 min        

## 2019-11-05 DIAGNOSIS — Z299 Encounter for prophylactic measures, unspecified: Secondary | ICD-10-CM | POA: Diagnosis not present

## 2019-11-05 DIAGNOSIS — Z6830 Body mass index (BMI) 30.0-30.9, adult: Secondary | ICD-10-CM | POA: Diagnosis not present

## 2019-11-05 DIAGNOSIS — K580 Irritable bowel syndrome with diarrhea: Secondary | ICD-10-CM | POA: Diagnosis not present

## 2019-11-05 DIAGNOSIS — J449 Chronic obstructive pulmonary disease, unspecified: Secondary | ICD-10-CM | POA: Diagnosis not present

## 2019-11-05 DIAGNOSIS — I10 Essential (primary) hypertension: Secondary | ICD-10-CM | POA: Diagnosis not present

## 2019-11-05 DIAGNOSIS — K5792 Diverticulitis of intestine, part unspecified, without perforation or abscess without bleeding: Secondary | ICD-10-CM | POA: Diagnosis not present

## 2019-11-28 ENCOUNTER — Other Ambulatory Visit: Payer: Self-pay | Admitting: Internal Medicine

## 2019-12-07 DIAGNOSIS — Z299 Encounter for prophylactic measures, unspecified: Secondary | ICD-10-CM | POA: Diagnosis not present

## 2019-12-07 DIAGNOSIS — N183 Chronic kidney disease, stage 3 unspecified: Secondary | ICD-10-CM | POA: Diagnosis not present

## 2019-12-07 DIAGNOSIS — K580 Irritable bowel syndrome with diarrhea: Secondary | ICD-10-CM | POA: Diagnosis not present

## 2019-12-07 DIAGNOSIS — I25119 Atherosclerotic heart disease of native coronary artery with unspecified angina pectoris: Secondary | ICD-10-CM | POA: Diagnosis not present

## 2019-12-07 DIAGNOSIS — J449 Chronic obstructive pulmonary disease, unspecified: Secondary | ICD-10-CM | POA: Diagnosis not present

## 2019-12-10 DIAGNOSIS — Z299 Encounter for prophylactic measures, unspecified: Secondary | ICD-10-CM | POA: Diagnosis not present

## 2019-12-10 DIAGNOSIS — J9611 Chronic respiratory failure with hypoxia: Secondary | ICD-10-CM | POA: Diagnosis not present

## 2019-12-10 DIAGNOSIS — J449 Chronic obstructive pulmonary disease, unspecified: Secondary | ICD-10-CM | POA: Diagnosis not present

## 2019-12-10 DIAGNOSIS — R197 Diarrhea, unspecified: Secondary | ICD-10-CM | POA: Diagnosis not present

## 2019-12-10 DIAGNOSIS — Z23 Encounter for immunization: Secondary | ICD-10-CM | POA: Diagnosis not present

## 2019-12-13 ENCOUNTER — Ambulatory Visit (INDEPENDENT_AMBULATORY_CARE_PROVIDER_SITE_OTHER): Payer: Medicare PPO | Admitting: Cardiology

## 2019-12-13 ENCOUNTER — Encounter: Payer: Self-pay | Admitting: Cardiology

## 2019-12-13 VITALS — BP 130/70 | HR 66 | Ht 59.0 in | Wt 153.8 lb

## 2019-12-13 DIAGNOSIS — E782 Mixed hyperlipidemia: Secondary | ICD-10-CM | POA: Diagnosis not present

## 2019-12-13 DIAGNOSIS — I25119 Atherosclerotic heart disease of native coronary artery with unspecified angina pectoris: Secondary | ICD-10-CM | POA: Diagnosis not present

## 2019-12-13 NOTE — Progress Notes (Signed)
Cardiology Office Note  Date: 12/13/2019   ID: Belinda [name], DOB Nov 19, 1925, MRN [id number]  PCP:  Monico Blitz, MD  Cardiologist:  Rozann Lesches, MD Electrophysiologist:  None   Chief Complaint  Patient presents with  . Cardiac follow-up    History of Present Illness: Belinda [name] is a 86 y.o. female last seen in April.  She is here today with her daughter for a follow-up visit.  Overall, reports no major change in status.  She remains functional with ADLs around the house.  Using a four-point walker.  Denies any recent falls.  She does not describe any active angina symptoms with current level of activity.  She had an interval follow-up visit with Pulmonary, I reviewed Dr. Gustavus Bryant note from September.  I reviewed her medications which are outlined below.  No changes made from a cardiac perspective.  She is due for a follow-up examination with lab work per PCP later this month.  Past Medical History:  Diagnosis Date  . Borderline diabetes   . Coronary atherosclerosis of native coronary artery    DES LAD and circumflex 1/09, LVEF 70%  . Essential hypertension   . GERD (gastroesophageal reflux disease)   . Glaucoma   . Mixed hyperlipidemia   . Overactive bladder     Past Surgical History:  Procedure Laterality Date  . APPENDECTOMY    . BACK SURGERY    . CHOLECYSTECTOMY    . COLONOSCOPY  07/15/2011   Procedure: COLONOSCOPY;  Surgeon: Rogene Houston, MD;  Location: [facility];  Service: Endoscopy;  Laterality: N/A;  930  . HERNIA REPAIR    . TONSILLECTOMY      Current Outpatient Medications  Medication Sig Dispense Refill  . acetaminophen (TYLENOL) 500 MG tablet Take 500 mg by mouth every 6 (six) hours as needed.    [name] albuterol (PROVENTIL) (2.5 MG/3ML) 0.083% nebulizer solution Take 3 mLs (2.5 mg total) by nebulization every 4 (four) hours as needed for wheezing or shortness of breath. 75 mL 12  . amLODipine (NORVASC) 5 MG tablet TAKE 1 TABLET BY MOUTH DAILY 30  tablet 9  . arformoterol (BROVANA) 15 MCG/2ML NEBU INHALE THE CONTENTS OF ONE VIAL TWICE DAILY 120 mL 11  . aspirin EC 81 MG tablet Take 1 tablet (81 mg total) by mouth daily. 90 tablet 3  . bisoprolol (ZEBETA) 5 MG tablet TAKE 1 TABLET BY MOUTH DAILY 30 tablet 11  . budesonide ([name]) 0.25 MG/2ML nebulizer solution INHALE THE CONTENTS OF ONE VIAL TWICE DAILY 120 mL 5  . escitalopram (LEXAPRO) 10 MG tablet     . isosorbide mononitrate (IMDUR) 30 MG 24 hr tablet Take 1 tablet (30 mg total) by mouth daily. 30 tablet 1  . levothyroxine (SYNTHROID) 25 MCG tablet Take 1 tablet by mouth daily.    . meclizine (ANTIVERT) 25 MG tablet Take 1 tablet by mouth Twice daily as needed.    . Melatonin 3 MG TABS Take 1 tablet by mouth at bedtime as needed.    . Multiple Vitamins-Minerals (ICAPS AREDS 2) CAPS Take 1 tablet by mouth 2 (two) times daily.    . OXYGEN 2lpm with sleep and as needed during the day DME- [facility]    . pantoprazole (PROTONIX) 40 MG tablet TAKE 1 TABLET BY MOUTH TWICE DAILY 30-60 MINUTES BEFORE MEALS 60 tablet 12  . pravastatin (PRAVACHOL) 20 MG tablet 1/2 tab by mouth once daily    . PROAIR HFA 108 (90 Base) MCG/ACT  inhaler inhale TWO puffs EVERY 4 HOURS AS NEEDED FOR wheezing OR SHORTNESS OF BREATH 8.5 g 2  . SIMBRINZA 1-0.2 % SUSP Place 1 drop into both eyes at bedtime.    . Vitamin D, Ergocalciferol, (DRISDOL) 50000 UNITS CAPS capsule Take 50,000 Units by mouth every Wednesday.      No current facility-administered medications for this visit.   Allergies:  Patient has no known allergies.   ROS: Hearing loss.  Physical Exam: VS:  BP 130/70   Pulse 66   Ht _0  (1.499 m)   Wt 153 lb 12.8 oz (69.8 kg)   SpO2 95%   BMI 31.06 kg/m , BMI Body mass index is 31.06 kg/m.  Wt Readings from Last 3 Encounters:  12/13/19 153 lb 12.8 oz (69.8 kg)  11/02/19 152 lb (68.9 kg)  06/06/19 153 lb 12.8 oz (69.8 kg)    General: Elderly woman, appears comfortable at rest. HEENT:  Conjunctiva and lids normal, wearing a mask. Neck: Supple, no elevated JVP or carotid bruits, no thyromegaly. Lungs: No wheezing or rhonchi, nonlabored breathing at rest. Cardiac: Regular rate and rhythm, no S3 or significant systolic murmur. Extremities: No pitting edema, distal pulses 2+.  ECG:  An ECG dated 06/06/2019 was personally reviewed today and demonstrated:  Sinus rhythm.  Recent Labwork:  January 2021: BUN 18, creatinine 0.71, potassium 4.1, AST 15, ALT 16  Other Studies Reviewed Today:  Echocardiogram 10/30/2012 [facility]): LVEF 29-24% with diastolic dysfunction, Mac with trace mitral regurgitation, mildly sclerotic aortic valve, mild tricuspid regurgitation with normal RVSP, mildly dilated right ventricle.  Assessment and Plan:  1.  CAD status post DES to the LAD and circumflex in 2009.  She does not report any active angina with current level of activity.  Plan to continue medical therapy and observation.  She is on aspirin, bisoprolol, Norvasc, Imdur, and Pravachol.  2.  Mixed hyperlipidemia, continues on Pravachol.  She is due for follow-up lab work with PCP later this month.  Medication Adjustments/Labs and Tests Ordered: Current medicines are reviewed at length with the patient today.  Concerns regarding medicines are outlined above.   Tests Ordered: No orders of the defined types were placed in this encounter.   Medication Changes: No orders of the defined types were placed in this encounter.   Disposition:  Follow up 6 months in the [place] office.  Signed, Satira Sark, MD, [facility] 12/13/2019 2:00 PM    [facility], [place], [place] [address] Phone: [phone]; Fax: [phone]

## 2019-12-13 NOTE — Patient Instructions (Signed)

## 2020-01-08 ENCOUNTER — Encounter (INDEPENDENT_AMBULATORY_CARE_PROVIDER_SITE_OTHER): Payer: Self-pay | Admitting: Gastroenterology

## 2020-01-08 DIAGNOSIS — Z79899 Other long term (current) drug therapy: Secondary | ICD-10-CM | POA: Diagnosis not present

## 2020-01-08 DIAGNOSIS — Z7189 Other specified counseling: Secondary | ICD-10-CM | POA: Diagnosis not present

## 2020-01-08 DIAGNOSIS — Z0000 Encounter for general adult medical examination without abnormal findings: Secondary | ICD-10-CM | POA: Diagnosis not present

## 2020-01-08 DIAGNOSIS — Z1331 Encounter for screening for depression: Secondary | ICD-10-CM | POA: Diagnosis not present

## 2020-01-08 DIAGNOSIS — I10 Essential (primary) hypertension: Secondary | ICD-10-CM | POA: Diagnosis not present

## 2020-01-08 DIAGNOSIS — R739 Hyperglycemia, unspecified: Secondary | ICD-10-CM | POA: Diagnosis not present

## 2020-01-08 DIAGNOSIS — Z6830 Body mass index (BMI) 30.0-30.9, adult: Secondary | ICD-10-CM | POA: Diagnosis not present

## 2020-01-08 DIAGNOSIS — E039 Hypothyroidism, unspecified: Secondary | ICD-10-CM | POA: Diagnosis not present

## 2020-01-08 DIAGNOSIS — Z299 Encounter for prophylactic measures, unspecified: Secondary | ICD-10-CM | POA: Diagnosis not present

## 2020-01-08 DIAGNOSIS — E6609 Other obesity due to excess calories: Secondary | ICD-10-CM | POA: Diagnosis not present

## 2020-01-08 DIAGNOSIS — E559 Vitamin D deficiency, unspecified: Secondary | ICD-10-CM | POA: Diagnosis not present

## 2020-01-08 DIAGNOSIS — R5383 Other fatigue: Secondary | ICD-10-CM | POA: Diagnosis not present

## 2020-01-08 DIAGNOSIS — E7800 Pure hypercholesterolemia, unspecified: Secondary | ICD-10-CM | POA: Diagnosis not present

## 2020-01-08 DIAGNOSIS — Z1339 Encounter for screening examination for other mental health and behavioral disorders: Secondary | ICD-10-CM | POA: Diagnosis not present

## 2020-01-23 DIAGNOSIS — Z299 Encounter for prophylactic measures, unspecified: Secondary | ICD-10-CM | POA: Diagnosis not present

## 2020-01-23 DIAGNOSIS — S8002XA Contusion of left knee, initial encounter: Secondary | ICD-10-CM | POA: Diagnosis not present

## 2020-01-23 DIAGNOSIS — G8911 Acute pain due to trauma: Secondary | ICD-10-CM | POA: Diagnosis not present

## 2020-01-23 DIAGNOSIS — M25562 Pain in left knee: Secondary | ICD-10-CM | POA: Diagnosis not present

## 2020-01-23 DIAGNOSIS — J449 Chronic obstructive pulmonary disease, unspecified: Secondary | ICD-10-CM | POA: Diagnosis not present

## 2020-01-23 DIAGNOSIS — I10 Essential (primary) hypertension: Secondary | ICD-10-CM | POA: Diagnosis not present

## 2020-01-23 DIAGNOSIS — N183 Chronic kidney disease, stage 3 unspecified: Secondary | ICD-10-CM | POA: Diagnosis not present

## 2020-01-23 DIAGNOSIS — M25462 Effusion, left knee: Secondary | ICD-10-CM | POA: Diagnosis not present

## 2020-02-13 ENCOUNTER — Encounter (INDEPENDENT_AMBULATORY_CARE_PROVIDER_SITE_OTHER): Payer: Self-pay | Admitting: Gastroenterology

## 2020-02-13 ENCOUNTER — Ambulatory Visit (INDEPENDENT_AMBULATORY_CARE_PROVIDER_SITE_OTHER): Payer: Medicare PPO | Admitting: Gastroenterology

## 2020-02-13 ENCOUNTER — Other Ambulatory Visit: Payer: Self-pay

## 2020-02-13 DIAGNOSIS — R197 Diarrhea, unspecified: Secondary | ICD-10-CM

## 2020-02-13 DIAGNOSIS — R1030 Lower abdominal pain, unspecified: Secondary | ICD-10-CM

## 2020-02-13 DIAGNOSIS — R109 Unspecified abdominal pain: Secondary | ICD-10-CM | POA: Insufficient documentation

## 2020-02-13 LAB — COMPREHENSIVE METABOLIC PANEL
AG Ratio: 1.6 (calc) (ref 1.0–2.5)
ALT: 14 U/L (ref 6–29)
AST: 17 U/L (ref 10–35)
Albumin: 4.2 g/dL (ref 3.6–5.1)
Alkaline phosphatase (APISO): 75 U/L (ref 37–153)
BUN/Creatinine Ratio: 21 (calc) (ref 6–22)
BUN: 24 mg/dL (ref 7–25)
CO2: 27 mmol/L (ref 20–32)
Calcium: 9.5 mg/dL (ref 8.6–10.4)
Chloride: 103 mmol/L (ref 98–110)
Creat: 1.12 mg/dL — ABNORMAL HIGH (ref 0.60–0.88)
Globulin: 2.6 g/dL (calc) (ref 1.9–3.7)
Glucose, Bld: 93 mg/dL (ref 65–139)
Potassium: 4.8 mmol/L (ref 3.5–5.3)
Sodium: 139 mmol/L (ref 135–146)
Total Bilirubin: 0.5 mg/dL (ref 0.2–1.2)
Total Protein: 6.8 g/dL (ref 6.1–8.1)

## 2020-02-13 LAB — CBC WITH DIFFERENTIAL/PLATELET
Absolute Monocytes: 616 cells/uL (ref 200–950)
Basophils Absolute: 42 cells/uL (ref 0–200)
Basophils Relative: 0.6 %
Eosinophils Absolute: 133 cells/uL (ref 15–500)
Eosinophils Relative: 1.9 %
HCT: 38.8 % (ref 35.0–45.0)
Hemoglobin: 13 g/dL (ref 11.7–15.5)
Lymphs Abs: 1603 cells/uL (ref 850–3900)
MCH: 33 pg (ref 27.0–33.0)
MCHC: 33.5 g/dL (ref 32.0–36.0)
MCV: 98.5 fL (ref 80.0–100.0)
MPV: 15.2 fL — ABNORMAL HIGH (ref 7.5–12.5)
Monocytes Relative: 8.8 %
Neutro Abs: 4606 cells/uL (ref 1500–7800)
Neutrophils Relative %: 65.8 %
Platelets: 147 10*3/uL (ref 140–400)
RBC: 3.94 10*6/uL (ref 3.80–5.10)
RDW: 12.1 % (ref 11.0–15.0)
Total Lymphocyte: 22.9 %
WBC: 7 10*3/uL (ref 3.8–10.8)

## 2020-02-13 LAB — TSH: TSH: 3.98 mIU/L (ref 0.40–4.50)

## 2020-02-13 MED ORDER — DICYCLOMINE HCL 10 MG PO CAPS: 10.0000 mg | ORAL_CAPSULE | Freq: Two times a day (BID) | ORAL | 2 refills | Status: AC | PRN

## 2020-02-13 NOTE — Progress Notes (Signed)
Maylon Peppers, M.D. Gastroenterology & Hepatology [facility] [address] [place], [place] [address] Primary Care Physician: Monico Blitz, [place] [address]  Referring MD: PCP  Chief Complaint: Chronic diarrhea  History of Present Illness: Belinda [name] is a 87 y.o. female with past medical history of coronary artery disease status post stent placement, hypertension, GERD, hyperlipidemia, who presents for evaluation of chronic diarrhea.  The patient reports that she has presented worsening recurrent episodes of diarrhea for the last year.  She states that 3 to 4 days/week she presents up to 4 soft bowel movements per day.  She feels some urgency and states that she has felt chronically lower abdominal pain bilaterally which she describes as a cramping pressure that does not radiate anywhere else.  She states that after having a bowel movement the pain improved substantially.  She denies having any watery bowel movements, melena or hematochezia.  She reports that for managing her diarrhea she takes Imodium when she has had 3 bowel movements and that usually stops the diarrhea episodes.  States that she has had some accidents for which she has to use a pad, this happens only couple of times per month.  Notably, she denies having any symptoms while sleeping or accidents during the night.  The daughter reports that she is currently taking Glucerna and body armor to avoid dehydration.  The patient denies having any nausea, vomiting, fever, chills, hematochezia, melena, hematemesis, abdominal distention, jaundice, pruritus or weight loss.  I personally reviewed the most recent available labs from 02/12/2019 showed CMP with mild elevation of glucose 120, with rest of hepatic, renal function panel within normal limits, normal electrolytes.  CBC from 02/07/2019 showed hemoglobin of 11.8 mildly decreased, MCV 98, thrombocytopenia of 102,  white blood cell count 4.7. Patient reports Had a recent negative C. difficile testing but this report is not available to me.  Her most recent abdominal imaging was a CT abdomen pelvis with IV contrast which showed diverticulosis, hiatal hernia but no other acute findings.  Notably, the patient came to our clinic in 2016 for episodes of diarrhea and urgency similar to her current presentation.  She was advised to take Imodium as needed and increase intake of fiber.  She does not remember this encounter.  Last Colonoscopy: 2013 - Scattered diverticula throughout the colon but predominantly at sigmoid colon. No polyps or other mucosal abnormalities noted. Normal rectal mucosa. Normal anal rectal junction.  FHx: neg for any gastrointestinal/liver disease, no malignancies Social: neg smoking, alcohol or illicit drug use Surgical: appendectomy  Past Medical History: Past Medical History:  Diagnosis Date  . Borderline diabetes   . Coronary atherosclerosis of native coronary artery    DES LAD and circumflex 1/09, LVEF 70%  . Essential hypertension   . GERD (gastroesophageal reflux disease)   . Glaucoma   . Mixed hyperlipidemia   . Overactive bladder     Past Surgical History: Past Surgical History:  Procedure Laterality Date  . APPENDECTOMY    . BACK SURGERY    . CHOLECYSTECTOMY    . COLONOSCOPY  07/15/2011   Procedure: COLONOSCOPY;  Surgeon: Rogene Houston, MD;  [facility];  Service: Endoscopy;  Laterality: N/A;  930  . HERNIA REPAIR    . TONSILLECTOMY      Family History:History reviewed. No pertinent family history.  Social History: Social History   Tobacco Use  Smoking Status Former Smoker  . Packs/day:  0.10  . Years: 2.00  . Pack years: 0.20  . Types: Cigarettes  . Quit date: 02/08/1946  . Years since quitting: 74.0  Smokeless Tobacco Never Used   Social History   Substance and Sexual Activity  Alcohol Use No  . Alcohol/week: 0.0 standard drinks    Social History   Substance and Sexual Activity  Drug Use No    Allergies: No Known Allergies  Medications: Current Outpatient Medications  Medication Sig Dispense Refill  . acetaminophen (TYLENOL) 500 MG tablet Take 500 mg by mouth every 6 (six) hours as needed.    [name] albuterol (PROVENTIL) (2.5 MG/3ML) 0.083% nebulizer solution Take 3 mLs (2.5 mg total) by nebulization every 4 (four) hours as needed for wheezing or shortness of breath. 75 mL 12  . amLODipine (NORVASC) 5 MG tablet TAKE 1 TABLET BY MOUTH DAILY 30 tablet 9  . arformoterol (BROVANA) 15 MCG/2ML NEBU INHALE THE CONTENTS OF ONE VIAL TWICE DAILY 120 mL 11  . aspirin EC 81 MG tablet Take 1 tablet (81 mg total) by mouth daily. 90 tablet 3  . bisoprolol (ZEBETA) 5 MG tablet TAKE 1 TABLET BY MOUTH DAILY 30 tablet 11  . budesonide ([name]) 0.25 MG/2ML nebulizer solution INHALE THE CONTENTS OF ONE VIAL TWICE DAILY 120 mL 5  . escitalopram (LEXAPRO) 10 MG tablet     . isosorbide mononitrate (IMDUR) 30 MG 24 hr tablet Take 1 tablet (30 mg total) by mouth daily. 30 tablet 1  . levothyroxine (SYNTHROID) 25 MCG tablet Take 1 tablet by mouth daily.    . meclizine (ANTIVERT) 25 MG tablet Take 1 tablet by mouth Twice daily as needed.    . Melatonin 3 MG TABS Take 1 tablet by mouth at bedtime as needed.    . Multiple Vitamins-Minerals (ICAPS AREDS 2) CAPS Take 1 tablet by mouth 2 (two) times daily.    . OXYGEN 2lpm with sleep and as needed during the day DME- [employer]    . pantoprazole (PROTONIX) 40 MG tablet TAKE 1 TABLET BY MOUTH TWICE DAILY 30-60 MINUTES BEFORE MEALS 60 tablet 12  . pravastatin (PRAVACHOL) 20 MG tablet 1/2 tab by mouth once daily    . PROAIR HFA 108 (90 Base) MCG/ACT inhaler inhale TWO puffs EVERY 4 HOURS AS NEEDED FOR wheezing OR SHORTNESS OF BREATH 8.5 g 2  . SIMBRINZA 1-0.2 % SUSP Place 1 drop into both eyes at bedtime.    . Vitamin D, Ergocalciferol, (DRISDOL) 50000 UNITS CAPS capsule Take 50,000 Units by mouth  every Wednesday.      No current facility-administered medications for this visit.    Review of Systems: GENERAL: negative for malaise, night sweats HEENT: No changes in hearing or vision, no nose bleeds or other nasal problems. NECK: Negative for lumps, goiter, pain and significant neck swelling RESPIRATORY: Negative for cough, wheezing CARDIOVASCULAR: Negative for chest pain, leg swelling, palpitations, orthopnea GI: SEE HPI MUSCULOSKELETAL: Negative for joint pain or swelling, back pain, and muscle pain. SKIN: Negative for lesions, rash PSYCH: Negative for sleep disturbance, mood disorder and recent psychosocial stressors. HEMATOLOGY Negative for prolonged bleeding, bruising easily, and swollen nodes. ENDOCRINE: Negative for cold or heat intolerance, polyuria, polydipsia and goiter. NEURO: negative for tremor, gait imbalance, syncope and seizures. The remainder of the review of systems is noncontributory.   Physical Exam: BP 103/62 (BP Location: Left Arm, Patient Position: Sitting, Cuff Size: Large)   Pulse 66   Temp (!) 97.2 F (36.2 C) (Oral)   Ht 4'  11" (1.499 m)   Wt 152 lb (68.9 kg)   BMI 30.70 kg/m  GENERAL: The patient is AO x3, in no acute distress. Elder. HEENT: Head is normocephalic and atraumatic. EOMI are intact. Mouth is well hydrated and without lesions. NECK: Supple. No masses LUNGS: Clear to auscultation. No presence of rhonchi/wheezing/rales. Adequate chest expansion HEART: RRR, normal s1 and s2. ABDOMEN: tender upon palpation of the abdomen diffusely but no guarding, no peritoneal signs, and nondistended. BS +. No masses. EXTREMITIES: Without any cyanosis, clubbing, rash, lesions or edema. NEUROLOGIC: AOx3, no focal motor deficit. SKIN: no jaundice, no rashes   Imaging/Labs: as above  I personally reviewed and interpreted the available labs, imaging and endoscopic files.  Impression and Plan: PEG FIFER is a 87 y.o. female with past medical  history of coronary artery disease status post stent placement, hypertension, GERD, hyperlipidemia, who presents for evaluation of chronic diarrhea.  Patient has had intermittent episodes of loose bowel movements without presence of red flag signs.  It seems that her symptoms have been present for years but have worsened recently as she presented worsening her symptoms for the last year.  We will check for bacterial infections with a GI pathogen panel, although I consider infectious etiologies less likely.  We will also check CBC, CMP and TSH at this moment.  Is possible that she could have some overflow diarrhea as she has had similar episodes in the past which given her age could be a possibility.  Also, given her age we will not pursue performing a colonoscopy at this moment to rule out microscopic colitis given the potential risks, as well as the partial improvement while taking Imodium.  We will also prescribe Bentyl as needed to improve her abdominal pain.  Will prescribe Bentyl for now to improve her abdominal pain.  Finally for investigation with a CT of the abdomen and pelvis with IV contrast will be performed to evaluate her symptoms.  -Check CBC, CMP and TSH -Perform GI path  -Schedule CT abdomen/pelvis with IV contrast -Can take Imodium 1 tab as needed if more than 2 bowel movements -Can take Bentyl 10 mg every 12 hours as needed for abdominal pain  All questions were answered.      Katrinka Blazing, MD Gastroenterology and Hepatology [facility]

## 2020-02-13 NOTE — Patient Instructions (Addendum)
Perform blood workup Perform stool workup Schedule CT abdomen/pelvis with IV contrast Can take Imodium 1 tab as needed if more than 2 bowel movements Can take Bentyl 10 mg every 12 hours as needed for abdominal pain

## 2020-02-19 DIAGNOSIS — R197 Diarrhea, unspecified: Secondary | ICD-10-CM | POA: Diagnosis not present

## 2020-02-20 LAB — GASTROINTESTINAL PATHOGEN PANEL PCR
C. difficile Tox A/B, PCR: NOT DETECTED
Campylobacter, PCR: NOT DETECTED
Cryptosporidium, PCR: NOT DETECTED
E coli (ETEC) LT/ST PCR: NOT DETECTED
E coli (STEC) stx1/stx2, PCR: NOT DETECTED
E coli 0157, PCR: NOT DETECTED
Giardia lamblia, PCR: NOT DETECTED
Norovirus, PCR: NOT DETECTED
Rotavirus A, PCR: NOT DETECTED
Salmonella, PCR: NOT DETECTED
Shigella, PCR: NOT DETECTED

## 2020-02-28 ENCOUNTER — Telehealth (INDEPENDENT_AMBULATORY_CARE_PROVIDER_SITE_OTHER): Payer: Self-pay | Admitting: Gastroenterology

## 2020-02-28 NOTE — Telephone Encounter (Signed)
Can we try moving the CT scan for an earlier date?

## 2020-02-28 NOTE — Telephone Encounter (Signed)
Belinda [name] per Our conversation please see if this can be moved, Thanks

## 2020-02-28 NOTE — Telephone Encounter (Signed)
I spoke with the patient's daughter Marylu Lund and she states the patient started feeling bad on Saturday night after eating she vomited. She felt bad on Sunday with lower abdominal pain. Monday patient vomited again that night. Tuesday was feeling better, then Wednesday patient started having diarrhea. She has not vomited since Monday, she is not running any fever. Patient is scheduled for CT scan on 03/06/2020 and daughter has to go by [facility] to pick up drink for the CT scan today. Per daughter should the CT scan be moved up or what do you recommend. Please advise.

## 2020-02-28 NOTE — Telephone Encounter (Signed)
Daughter Marylu Lund) called the office stated patient has been having abd pain with vomiting and diarrhea - she would like to move the CT up sooner if possible - please advise Marylu Lund ph# [phone]

## 2020-03-06 ENCOUNTER — Ambulatory Visit (HOSPITAL_COMMUNITY)
Admission: RE | Admit: 2020-03-06 | Discharge: 2020-03-06 | Disposition: A | Discharge status: Home / Self Care | Payer: Medicare PPO | Source: Ambulatory Visit | Attending: Gastroenterology | Admitting: Gastroenterology

## 2020-03-06 ENCOUNTER — Encounter (HOSPITAL_COMMUNITY): Payer: Self-pay | Admitting: Radiology

## 2020-03-06 ENCOUNTER — Other Ambulatory Visit: Payer: Self-pay

## 2020-03-06 DIAGNOSIS — I517 Cardiomegaly: Secondary | ICD-10-CM | POA: Diagnosis not present

## 2020-03-06 DIAGNOSIS — K5730 Diverticulosis of large intestine without perforation or abscess without bleeding: Secondary | ICD-10-CM | POA: Insufficient documentation

## 2020-03-06 DIAGNOSIS — R197 Diarrhea, unspecified: Secondary | ICD-10-CM | POA: Insufficient documentation

## 2020-03-06 DIAGNOSIS — I700 Atherosclerosis of aorta: Secondary | ICD-10-CM | POA: Insufficient documentation

## 2020-03-06 DIAGNOSIS — R109 Unspecified abdominal pain: Secondary | ICD-10-CM | POA: Diagnosis not present

## 2020-03-06 MED ORDER — IOHEXOL 300 MG/ML  SOLN
80.0000 mL | Freq: Once | INTRAMUSCULAR | Status: AC | PRN
  Administered 2020-03-06: 80 mL via INTRAVENOUS

## 2020-03-11 ENCOUNTER — Other Ambulatory Visit (INDEPENDENT_AMBULATORY_CARE_PROVIDER_SITE_OTHER): Payer: Self-pay

## 2020-03-11 DIAGNOSIS — N3289 Other specified disorders of bladder: Secondary | ICD-10-CM

## 2020-03-11 DIAGNOSIS — R350 Frequency of micturition: Secondary | ICD-10-CM

## 2020-03-13 DIAGNOSIS — R350 Frequency of micturition: Secondary | ICD-10-CM | POA: Diagnosis not present

## 2020-03-13 DIAGNOSIS — N3289 Other specified disorders of bladder: Secondary | ICD-10-CM | POA: Diagnosis not present

## 2020-03-13 LAB — URINALYSIS, ROUTINE W REFLEX MICROSCOPIC
Bilirubin Urine: NEGATIVE
Glucose, UA: NEGATIVE
Hyaline Cast: NONE SEEN /LPF
Ketones, ur: NEGATIVE
Nitrite: POSITIVE — AB
Protein, ur: NEGATIVE
Specific Gravity, Urine: 1.012 (ref 1.001–1.03)
WBC, UA: >=60 /HPF — AB (ref 0–5)
pH: 6.5 (ref 5.0–8.0)

## 2020-03-14 ENCOUNTER — Other Ambulatory Visit (INDEPENDENT_AMBULATORY_CARE_PROVIDER_SITE_OTHER): Payer: Self-pay

## 2020-03-14 DIAGNOSIS — N390 Urinary tract infection, site not specified: Secondary | ICD-10-CM | POA: Diagnosis not present

## 2020-03-16 LAB — URINE CULTURE
MICRO NUMBER:: 11497721
SPECIMEN QUALITY:: ADEQUATE

## 2020-03-17 ENCOUNTER — Other Ambulatory Visit (INDEPENDENT_AMBULATORY_CARE_PROVIDER_SITE_OTHER): Payer: Self-pay | Admitting: Gastroenterology

## 2020-03-17 DIAGNOSIS — N3000 Acute cystitis without hematuria: Secondary | ICD-10-CM

## 2020-03-17 MED ORDER — NITROFURANTOIN MONOHYD MACRO 100 MG PO CAPS
100.0000 mg | ORAL_CAPSULE | Freq: Two times a day (BID) | ORAL | 0 refills | Status: AC
Start: 2020-03-17 — End: 2020-03-31

## 2020-03-18 ENCOUNTER — Telehealth (INDEPENDENT_AMBULATORY_CARE_PROVIDER_SITE_OTHER): Payer: Self-pay

## 2020-03-18 NOTE — Telephone Encounter (Signed)
Jan daughter aware patient has a UTI. She states she spoke with Dr. Levon Hedger regarding this and was aware he sent in an antibiotic for the patient due to the UTI.

## 2020-03-26 ENCOUNTER — Other Ambulatory Visit: Payer: Self-pay | Admitting: Internal Medicine

## 2020-03-28 DIAGNOSIS — Z299 Encounter for prophylactic measures, unspecified: Secondary | ICD-10-CM | POA: Diagnosis not present

## 2020-03-28 DIAGNOSIS — N3090 Cystitis, unspecified without hematuria: Secondary | ICD-10-CM | POA: Diagnosis not present

## 2020-03-28 DIAGNOSIS — N183 Chronic kidney disease, stage 3 unspecified: Secondary | ICD-10-CM | POA: Diagnosis not present

## 2020-03-28 DIAGNOSIS — J449 Chronic obstructive pulmonary disease, unspecified: Secondary | ICD-10-CM | POA: Diagnosis not present

## 2020-03-28 DIAGNOSIS — I10 Essential (primary) hypertension: Secondary | ICD-10-CM | POA: Diagnosis not present

## 2020-03-28 DIAGNOSIS — Z789 Other specified health status: Secondary | ICD-10-CM | POA: Diagnosis not present

## 2020-03-28 DIAGNOSIS — Z6830 Body mass index (BMI) 30.0-30.9, adult: Secondary | ICD-10-CM | POA: Diagnosis not present

## 2020-04-07 DIAGNOSIS — I517 Cardiomegaly: Secondary | ICD-10-CM | POA: Diagnosis not present

## 2020-04-07 DIAGNOSIS — Z79899 Other long term (current) drug therapy: Secondary | ICD-10-CM | POA: Diagnosis not present

## 2020-04-07 DIAGNOSIS — K529 Noninfective gastroenteritis and colitis, unspecified: Secondary | ICD-10-CM | POA: Diagnosis not present

## 2020-04-07 DIAGNOSIS — Z7982 Long term (current) use of aspirin: Secondary | ICD-10-CM | POA: Diagnosis not present

## 2020-04-07 DIAGNOSIS — R197 Diarrhea, unspecified: Secondary | ICD-10-CM | POA: Diagnosis not present

## 2020-04-07 DIAGNOSIS — R059 Cough, unspecified: Secondary | ICD-10-CM | POA: Diagnosis not present

## 2020-04-07 DIAGNOSIS — R109 Unspecified abdominal pain: Secondary | ICD-10-CM | POA: Diagnosis not present

## 2020-04-07 DIAGNOSIS — R112 Nausea with vomiting, unspecified: Secondary | ICD-10-CM | POA: Diagnosis not present

## 2020-04-07 DIAGNOSIS — K5790 Diverticulosis of intestine, part unspecified, without perforation or abscess without bleeding: Secondary | ICD-10-CM | POA: Diagnosis not present

## 2020-04-07 DIAGNOSIS — R1110 Vomiting, unspecified: Secondary | ICD-10-CM | POA: Diagnosis not present

## 2020-04-07 DIAGNOSIS — I700 Atherosclerosis of aorta: Secondary | ICD-10-CM | POA: Diagnosis not present

## 2020-04-09 DIAGNOSIS — Z299 Encounter for prophylactic measures, unspecified: Secondary | ICD-10-CM | POA: Diagnosis not present

## 2020-04-09 DIAGNOSIS — I25119 Atherosclerotic heart disease of native coronary artery with unspecified angina pectoris: Secondary | ICD-10-CM | POA: Diagnosis not present

## 2020-04-09 DIAGNOSIS — I10 Essential (primary) hypertension: Secondary | ICD-10-CM | POA: Diagnosis not present

## 2020-04-09 DIAGNOSIS — K589 Irritable bowel syndrome without diarrhea: Secondary | ICD-10-CM | POA: Diagnosis not present

## 2020-04-09 DIAGNOSIS — Z6830 Body mass index (BMI) 30.0-30.9, adult: Secondary | ICD-10-CM | POA: Diagnosis not present

## 2020-04-09 DIAGNOSIS — R350 Frequency of micturition: Secondary | ICD-10-CM | POA: Diagnosis not present

## 2020-04-09 DIAGNOSIS — Z789 Other specified health status: Secondary | ICD-10-CM | POA: Diagnosis not present

## 2020-04-10 DIAGNOSIS — K589 Irritable bowel syndrome without diarrhea: Secondary | ICD-10-CM | POA: Diagnosis not present

## 2020-04-25 DIAGNOSIS — W19XXXA Unspecified fall, initial encounter: Secondary | ICD-10-CM | POA: Diagnosis not present

## 2020-04-25 DIAGNOSIS — Y92009 Unspecified place in unspecified non-institutional (private) residence as the place of occurrence of the external cause: Secondary | ICD-10-CM | POA: Diagnosis not present

## 2020-04-25 DIAGNOSIS — I10 Essential (primary) hypertension: Secondary | ICD-10-CM | POA: Diagnosis not present

## 2020-04-25 DIAGNOSIS — S92325A Nondisplaced fracture of second metatarsal bone, left foot, initial encounter for closed fracture: Secondary | ICD-10-CM | POA: Diagnosis not present

## 2020-04-25 DIAGNOSIS — M7989 Other specified soft tissue disorders: Secondary | ICD-10-CM | POA: Diagnosis not present

## 2020-04-25 DIAGNOSIS — Z299 Encounter for prophylactic measures, unspecified: Secondary | ICD-10-CM | POA: Diagnosis not present

## 2020-04-28 DIAGNOSIS — S93602D Unspecified sprain of left foot, subsequent encounter: Secondary | ICD-10-CM | POA: Diagnosis not present

## 2020-04-28 DIAGNOSIS — S92325D Nondisplaced fracture of second metatarsal bone, left foot, subsequent encounter for fracture with routine healing: Secondary | ICD-10-CM | POA: Diagnosis not present

## 2020-05-12 DIAGNOSIS — S92325D Nondisplaced fracture of second metatarsal bone, left foot, subsequent encounter for fracture with routine healing: Secondary | ICD-10-CM | POA: Diagnosis not present

## 2020-05-12 DIAGNOSIS — S93602D Unspecified sprain of left foot, subsequent encounter: Secondary | ICD-10-CM | POA: Diagnosis not present

## 2020-05-13 DIAGNOSIS — H26493 Other secondary cataract, bilateral: Secondary | ICD-10-CM | POA: Diagnosis not present

## 2020-05-13 DIAGNOSIS — Z961 Presence of intraocular lens: Secondary | ICD-10-CM | POA: Diagnosis not present

## 2020-05-13 DIAGNOSIS — E559 Vitamin D deficiency, unspecified: Secondary | ICD-10-CM | POA: Diagnosis not present

## 2020-05-13 DIAGNOSIS — R5383 Other fatigue: Secondary | ICD-10-CM | POA: Diagnosis not present

## 2020-05-13 DIAGNOSIS — M810 Age-related osteoporosis without current pathological fracture: Secondary | ICD-10-CM | POA: Diagnosis not present

## 2020-05-13 DIAGNOSIS — H401122 Primary open-angle glaucoma, left eye, moderate stage: Secondary | ICD-10-CM | POA: Diagnosis not present

## 2020-05-13 DIAGNOSIS — H401112 Primary open-angle glaucoma, right eye, moderate stage: Secondary | ICD-10-CM | POA: Diagnosis not present

## 2020-05-15 ENCOUNTER — Ambulatory Visit (INDEPENDENT_AMBULATORY_CARE_PROVIDER_SITE_OTHER): Payer: Medicare PPO | Admitting: Gastroenterology

## 2020-05-15 DIAGNOSIS — S92902A Unspecified fracture of left foot, initial encounter for closed fracture: Secondary | ICD-10-CM | POA: Diagnosis not present

## 2020-05-15 DIAGNOSIS — Z6831 Body mass index (BMI) 31.0-31.9, adult: Secondary | ICD-10-CM | POA: Diagnosis not present

## 2020-05-15 DIAGNOSIS — J449 Chronic obstructive pulmonary disease, unspecified: Secondary | ICD-10-CM | POA: Diagnosis not present

## 2020-05-15 DIAGNOSIS — R197 Diarrhea, unspecified: Secondary | ICD-10-CM | POA: Diagnosis not present

## 2020-05-15 DIAGNOSIS — Z299 Encounter for prophylactic measures, unspecified: Secondary | ICD-10-CM | POA: Diagnosis not present

## 2020-05-15 DIAGNOSIS — I10 Essential (primary) hypertension: Secondary | ICD-10-CM | POA: Diagnosis not present

## 2020-05-16 DIAGNOSIS — M810 Age-related osteoporosis without current pathological fracture: Secondary | ICD-10-CM | POA: Diagnosis not present

## 2020-05-16 DIAGNOSIS — M8589 Other specified disorders of bone density and structure, multiple sites: Secondary | ICD-10-CM | POA: Diagnosis not present

## 2020-05-16 DIAGNOSIS — Z780 Asymptomatic menopausal state: Secondary | ICD-10-CM | POA: Diagnosis not present

## 2020-05-19 ENCOUNTER — Other Ambulatory Visit: Payer: Self-pay | Admitting: Internal Medicine

## 2020-05-19 ENCOUNTER — Other Ambulatory Visit: Payer: Self-pay | Admitting: Cardiology

## 2020-06-09 DIAGNOSIS — M810 Age-related osteoporosis without current pathological fracture: Secondary | ICD-10-CM | POA: Diagnosis not present

## 2020-06-09 DIAGNOSIS — S92325D Nondisplaced fracture of second metatarsal bone, left foot, subsequent encounter for fracture with routine healing: Secondary | ICD-10-CM | POA: Diagnosis not present

## 2020-06-11 ENCOUNTER — Ambulatory Visit: Payer: Medicare PPO | Admitting: Cardiology

## 2020-06-16 DIAGNOSIS — M810 Age-related osteoporosis without current pathological fracture: Secondary | ICD-10-CM | POA: Diagnosis not present

## 2020-06-16 DIAGNOSIS — E559 Vitamin D deficiency, unspecified: Secondary | ICD-10-CM | POA: Diagnosis not present

## 2020-06-22 NOTE — Progress Notes (Signed)
Cardiology Office Note  Date: 06/23/2020   ID: [name], DOB [birth year], MRN [id number]  PCP:  Monico Blitz, MD  Cardiologist:  Rozann Lesches, MD Electrophysiologist:  None   Chief Complaint  Patient presents with  . Cardiac follow-up    History of Present Illness: Belinda [name] is a 87 y.o. female last seen in November 2021.  She is here today with her daughter for a follow-up visit.  She does not report any active angina symptoms on medical therapy.  Main complaint is of recurring diarrhea and spells of weakness.  CT imaging of her abdomen and pelvis earlier this year did show severe sigmoid diverticulosis, however no evidence of diverticulitis.  Also incidentally noted UTI which was treated.  We went over her medications and discussed reducing Norvasc to 2.5 mg daily, other than otherwise no change in present cardiac regimen.  I personally reviewed her ECG today which shows sinus rhythm with poor R wave progression.  Past Medical History:  Diagnosis Date  . Borderline diabetes   . Coronary atherosclerosis of native coronary artery    DES LAD and circumflex 1/09, LVEF 70%  . Essential hypertension   . GERD (gastroesophageal reflux disease)   . Glaucoma   . Mixed hyperlipidemia   . Overactive bladder     Past Surgical History:  Procedure Laterality Date  . APPENDECTOMY    . BACK SURGERY    . CHOLECYSTECTOMY    . COLONOSCOPY  07/15/2011   Procedure: COLONOSCOPY;  Surgeon: Rogene Houston, MD;  [facility];  Service: Endoscopy;  Laterality: N/A;  930  . HERNIA REPAIR    . TONSILLECTOMY      Current Outpatient Medications  Medication Sig Dispense Refill  . acetaminophen (TYLENOL) 500 MG tablet Take 500 mg by mouth every 6 (six) hours as needed.    [name] albuterol (PROVENTIL) (2.5 MG/3ML) 0.083% nebulizer solution Take 3 mLs (2.5 mg total) by nebulization every 4 (four) hours as needed for wheezing or shortness of breath. 75 mL 12  . amLODipine  (NORVASC) 2.5 MG tablet Take 1 tablet (2.5 mg total) by mouth daily. 90 tablet 1  . arformoterol (BROVANA) 15 MCG/2ML NEBU INHALE THE CONTENTS OF ONE VIAL TWICE DAILY 120 mL 11  . aspirin EC 81 MG tablet Take 1 tablet (81 mg total) by mouth daily. 90 tablet 3  . bisoprolol (ZEBETA) 5 MG tablet TAKE 1 TABLET BY MOUTH DAILY 30 tablet 11  . brimonidine (ALPHAGAN) 0.15 % ophthalmic solution Place 1 drop into both eyes 3 (three) times daily.    . budesonide ([name]) 0.25 MG/2ML nebulizer solution INHALE THE CONTENTS OF ONE VIAL TWICE DAILY 120 mL 5  . dicyclomine (BENTYL) 10 MG capsule Take 1 capsule (10 mg total) by mouth every 12 (twelve) hours as needed (abdominal pain). 60 capsule 2  . dorzolamide (TRUSOPT) 2 % ophthalmic solution Place 1 drop into both eyes 3 (three) times daily.    [name] escitalopram (LEXAPRO) 10 MG tablet Take 10 mg by mouth daily.    . isosorbide mononitrate (IMDUR) 30 MG 24 hr tablet Take 1 tablet (30 mg total) by mouth daily. 30 tablet 1  . latanoprost (XALATAN) 0.005 % ophthalmic solution Place 1 drop into both eyes at bedtime.    [name] levothyroxine (SYNTHROID) 25 MCG tablet Take 1 tablet by mouth daily.    . meclizine (ANTIVERT) 25 MG tablet Take 1 tablet by mouth Twice daily as needed.    [name]  Melatonin 3 MG TABS Take 1 tablet by mouth at bedtime as needed.    . Multiple Vitamins-Minerals (ICAPS AREDS 2) CAPS Take 1 tablet by mouth 2 (two) times daily.    . OXYGEN 2lpm with sleep and as needed during the day DME- [facility]    . pantoprazole (PROTONIX) 40 MG tablet TAKE 1 TABLET BY MOUTH TWICE DAILY 30-60 MINUTES BEFORE MEALS 60 tablet 0  . pravastatin (PRAVACHOL) 20 MG tablet 1/2 tab by mouth once daily    . PROAIR HFA 108 (90 Base) MCG/ACT inhaler inhale TWO puffs EVERY 4 HOURS AS NEEDED FOR wheezing OR SHORTNESS OF BREATH 8.5 g 2  . SIMBRINZA 1-0.2 % SUSP Place 1 drop into both eyes at bedtime.    . Vitamin D, Ergocalciferol, (DRISDOL) 50000 UNITS CAPS capsule Take 50,000 Units  by mouth every Wednesday.      No current facility-administered medications for this visit.   Allergies:  Patient has no known allergies.   ROS: No syncope.  Physical Exam: VS:  BP 104/60   Pulse 60   Ht _0  (1.499 m)   Wt 152 lb 6.4 oz (69.1 kg)   SpO2 95%   BMI 30.78 kg/m , BMI Body mass index is 30.78 kg/m.  Wt Readings from Last 3 Encounters:  06/23/20 152 lb 6.4 oz (69.1 kg)  02/13/20 152 lb (68.9 kg)  12/13/19 153 lb 12.8 oz (69.8 kg)    General: Elderly woman, no distress. HEENT: Conjunctiva and lids normal, wearing a mask. Neck: Supple, no elevated JVP or carotid bruits, no thyromegaly. Lungs: Clear to auscultation, nonlabored breathing at rest. Cardiac: Regular rate and rhythm, no S3 or significant systolic murmur, no pericardial rub. Extremities: No pitting edema.  ECG:  An ECG dated 06/06/2019 was personally reviewed today and demonstrated:  Sinus rhythm.  Recent Labwork: 02/13/2020: ALT 14; AST 17; BUN 24; Creat 1.12; Hemoglobin 13.0; Platelets 147; Potassium 4.8; Sodium 139; TSH 3.98   Other Studies Reviewed Today:  Echocardiogram 10/30/2012 [facility]): LVEF 16-10% with diastolic dysfunction, Mac with trace mitral regurgitation, mildly sclerotic aortic valve, mild tricuspid regurgitation with normal RVSP, mildly dilated right ventricle.  Assessment and Plan:  1.  CAD status post DES to the LAD and circumflex in 2009.  She does not report any progressive angina and ECG is stable.  Continue medical therapy and observation.  She is on aspirin, bisoprolol, Imdur, and Pravachol.  Reduce Norvasc to 2.5 mg daily.  2.  Mixed hyperlipidemia on Pravachol.  She continues to follow with Dr. Manuella Ghazi.  Medication Adjustments/Labs and Tests Ordered: Current medicines are reviewed at length with the patient today.  Concerns regarding medicines are outlined above.   Tests Ordered: Orders Placed This Encounter  Procedures  . EKG 12-Lead    Medication  Changes: Meds ordered this encounter  Medications  . amLODipine (NORVASC) 2.5 MG tablet    Sig: Take 1 tablet (2.5 mg total) by mouth daily.    Dispense:  90 tablet    Refill:  1    06/23/2020 dose decrease    Disposition:  Follow up 6 months.  Signed, Satira Sark, MD, [facility] 06/23/2020 1:29 PM    [place] [facility] [place], [place], [place] [address] Phone: [phone]; Fax: [phone]

## 2020-06-23 ENCOUNTER — Encounter: Payer: Self-pay | Admitting: Cardiology

## 2020-06-23 ENCOUNTER — Ambulatory Visit (INDEPENDENT_AMBULATORY_CARE_PROVIDER_SITE_OTHER): Payer: Medicare PPO | Admitting: Cardiology

## 2020-06-23 VITALS — BP 104/60 | HR 60 | Ht 59.0 in | Wt 152.4 lb

## 2020-06-23 DIAGNOSIS — I25119 Atherosclerotic heart disease of native coronary artery with unspecified angina pectoris: Secondary | ICD-10-CM | POA: Diagnosis not present

## 2020-06-23 DIAGNOSIS — E782 Mixed hyperlipidemia: Secondary | ICD-10-CM | POA: Diagnosis not present

## 2020-06-23 MED ORDER — AMLODIPINE BESYLATE 2.5 MG PO TABS: 2.5000 mg | ORAL_TABLET | Freq: Every day | ORAL | 1 refills | Status: DC

## 2020-06-23 NOTE — Patient Instructions (Signed)
Medication Instructions:   Your physician has recommended you make the following change in your medication:   Decrease amlodipine to 2.5 mg by mouth daily  Continue other medications the same  Labwork:  none  Testing/Procedures:  none  Follow-Up:  Your physician recommends that you schedule a follow-up appointment in: 6 months. You will receive a reminder call or letter in the mail in about 4 months reminding you to call and schedule your appointment. If you don't receive this letter, please contact our office.  Any Other Special Instructions Will Be Listed Below (If Applicable).  If you need a refill on your cardiac medications before your next appointment, please call your pharmacy.

## 2020-06-24 ENCOUNTER — Other Ambulatory Visit: Payer: Self-pay | Admitting: Internal Medicine

## 2020-06-29 DIAGNOSIS — I739 Peripheral vascular disease, unspecified: Secondary | ICD-10-CM | POA: Diagnosis not present

## 2020-06-29 DIAGNOSIS — S0181XA Laceration without foreign body of other part of head, initial encounter: Secondary | ICD-10-CM | POA: Diagnosis not present

## 2020-06-29 DIAGNOSIS — S199XXA Unspecified injury of neck, initial encounter: Secondary | ICD-10-CM | POA: Diagnosis not present

## 2020-06-29 DIAGNOSIS — S0101XA Laceration without foreign body of scalp, initial encounter: Secondary | ICD-10-CM | POA: Diagnosis not present

## 2020-06-29 DIAGNOSIS — S0990XA Unspecified injury of head, initial encounter: Secondary | ICD-10-CM | POA: Diagnosis not present

## 2020-06-29 DIAGNOSIS — J449 Chronic obstructive pulmonary disease, unspecified: Secondary | ICD-10-CM | POA: Diagnosis not present

## 2020-06-29 DIAGNOSIS — I517 Cardiomegaly: Secondary | ICD-10-CM | POA: Diagnosis not present

## 2020-06-29 DIAGNOSIS — D649 Anemia, unspecified: Secondary | ICD-10-CM | POA: Diagnosis not present

## 2020-06-29 DIAGNOSIS — W182XXA Fall in (into) shower or empty bathtub, initial encounter: Secondary | ICD-10-CM | POA: Diagnosis not present

## 2020-06-29 DIAGNOSIS — R58 Hemorrhage, not elsewhere classified: Secondary | ICD-10-CM | POA: Diagnosis not present

## 2020-06-29 DIAGNOSIS — M47812 Spondylosis without myelopathy or radiculopathy, cervical region: Secondary | ICD-10-CM | POA: Diagnosis not present

## 2020-06-29 DIAGNOSIS — W19XXXA Unspecified fall, initial encounter: Secondary | ICD-10-CM | POA: Diagnosis not present

## 2020-06-29 DIAGNOSIS — G319 Degenerative disease of nervous system, unspecified: Secondary | ICD-10-CM | POA: Diagnosis not present

## 2020-06-29 DIAGNOSIS — I119 Hypertensive heart disease without heart failure: Secondary | ICD-10-CM | POA: Diagnosis not present

## 2020-07-01 DIAGNOSIS — I10 Essential (primary) hypertension: Secondary | ICD-10-CM | POA: Diagnosis not present

## 2020-07-01 DIAGNOSIS — I700 Atherosclerosis of aorta: Secondary | ICD-10-CM | POA: Diagnosis not present

## 2020-07-01 DIAGNOSIS — Z6831 Body mass index (BMI) 31.0-31.9, adult: Secondary | ICD-10-CM | POA: Diagnosis not present

## 2020-07-01 DIAGNOSIS — J449 Chronic obstructive pulmonary disease, unspecified: Secondary | ICD-10-CM | POA: Diagnosis not present

## 2020-07-01 DIAGNOSIS — Z299 Encounter for prophylactic measures, unspecified: Secondary | ICD-10-CM | POA: Diagnosis not present

## 2020-07-01 DIAGNOSIS — N183 Chronic kidney disease, stage 3 unspecified: Secondary | ICD-10-CM | POA: Diagnosis not present

## 2020-07-11 DIAGNOSIS — N183 Chronic kidney disease, stage 3 unspecified: Secondary | ICD-10-CM | POA: Diagnosis not present

## 2020-07-11 DIAGNOSIS — Z299 Encounter for prophylactic measures, unspecified: Secondary | ICD-10-CM | POA: Diagnosis not present

## 2020-07-11 DIAGNOSIS — I10 Essential (primary) hypertension: Secondary | ICD-10-CM | POA: Diagnosis not present

## 2020-07-11 DIAGNOSIS — Z713 Dietary counseling and surveillance: Secondary | ICD-10-CM | POA: Diagnosis not present

## 2020-07-23 ENCOUNTER — Other Ambulatory Visit: Payer: Self-pay | Admitting: Internal Medicine

## 2020-07-31 DIAGNOSIS — H401112 Primary open-angle glaucoma, right eye, moderate stage: Secondary | ICD-10-CM | POA: Diagnosis not present

## 2020-08-14 DIAGNOSIS — R42 Dizziness and giddiness: Secondary | ICD-10-CM | POA: Diagnosis not present

## 2020-08-14 DIAGNOSIS — I10 Essential (primary) hypertension: Secondary | ICD-10-CM | POA: Diagnosis not present

## 2020-08-14 DIAGNOSIS — Z6830 Body mass index (BMI) 30.0-30.9, adult: Secondary | ICD-10-CM | POA: Diagnosis not present

## 2020-08-14 DIAGNOSIS — N183 Chronic kidney disease, stage 3 unspecified: Secondary | ICD-10-CM | POA: Diagnosis not present

## 2020-08-14 DIAGNOSIS — I25119 Atherosclerotic heart disease of native coronary artery with unspecified angina pectoris: Secondary | ICD-10-CM | POA: Diagnosis not present

## 2020-08-14 DIAGNOSIS — Z299 Encounter for prophylactic measures, unspecified: Secondary | ICD-10-CM | POA: Diagnosis not present

## 2020-08-25 ENCOUNTER — Other Ambulatory Visit: Payer: Self-pay

## 2020-08-25 ENCOUNTER — Encounter (INDEPENDENT_AMBULATORY_CARE_PROVIDER_SITE_OTHER): Payer: Self-pay | Admitting: Gastroenterology

## 2020-08-25 ENCOUNTER — Ambulatory Visit (INDEPENDENT_AMBULATORY_CARE_PROVIDER_SITE_OTHER): Payer: Medicare PPO | Admitting: Gastroenterology

## 2020-08-25 VITALS — BP 111/65 | HR 71 | Temp 99.0°F | Ht 59.0 in | Wt 151.0 lb

## 2020-08-25 DIAGNOSIS — R1314 Dysphagia, pharyngoesophageal phase: Secondary | ICD-10-CM

## 2020-08-25 DIAGNOSIS — K591 Functional diarrhea: Secondary | ICD-10-CM | POA: Diagnosis not present

## 2020-08-25 DIAGNOSIS — R1310 Dysphagia, unspecified: Secondary | ICD-10-CM | POA: Insufficient documentation

## 2020-08-25 NOTE — Progress Notes (Signed)
Belinda [name], M.D. Gastroenterology & Hepatology [facility] [address] [place], [place] [address]  Primary Care Physician: Kirstie Peri, MD [address] [place] [address]  I will communicate my assessment and recommendations to the referring MD via EMR.  Problems: Choking episodes Episodes of diarrhea, likely functional  History of Present Illness: Belinda [name] is a 87 y.o. female with past medical history of coronary artery disease status post stent placement, hypertension, GERD, hyperlipidemia who presents for follow up of episodes of diarrhea and choking.  The patient was last seen on 02/13/2020. At that time, the patient had CBC, CMP and TSH checked, as well as a GI pathogen panel which were within normal limits.  Also underwent a CT of the abdomen pelvis with IV contrast which showed diverticulosis without diverticulitis and presence of cystitis changes in the bladder.  She was given treatment with instrumenting for urine infection based on the urinalysis and urine culture findings.  Patient states that her diarrhea has been getting better, as she mostly has 1-2 Bms per day. She has taken some Imodium as needed but not daily as her symptoms are getting better. She reports some pain in her lower abdomen before having a bowel movement but it resolves with defecation.  States that the abdominal pain is very mild in severity.  She has felt some choking episodes when swallowing food, only with solid food. States this has been going for a little longer than a year. Sometimes she coughs the food and it resolves on its own.  The patient denies having any nausea, vomiting, fever, chills, hematochezia, melena, hematemesis, abdominal distention, jaundice, pruritus or weight loss.  Last Colonoscopy: 2013 - Scattered diverticula throughout the colon but predominantly at sigmoid colon. No polyps or other mucosal abnormalities noted. Normal  rectal mucosa. Normal anal rectal junction.  Past Medical History: Past Medical History:  Diagnosis Date   Borderline diabetes    Coronary atherosclerosis of native coronary artery    DES LAD and circumflex 1/09, LVEF 70%   Essential hypertension    GERD (gastroesophageal reflux disease)    Glaucoma    Mixed hyperlipidemia    Overactive bladder     Past Surgical History: Past Surgical History:  Procedure Laterality Date   APPENDECTOMY     BACK SURGERY     CHOLECYSTECTOMY     COLONOSCOPY  07/15/2011   Procedure: COLONOSCOPY;  Surgeon: Malissa Hippo, MD;  [facility];  Service: Endoscopy;  Laterality: N/A;  930   HERNIA REPAIR     TONSILLECTOMY      Family History:History reviewed. No pertinent family history.  Social History: Social History   Tobacco Use  Smoking Status Former   Packs/day: 0.10   Years: 2.00   Pack years: 0.20   Types: Cigarettes   Quit date: 02/08/1946   Years since quitting: 74.5  Smokeless Tobacco Never   Social History   Substance and Sexual Activity  Alcohol Use No   Alcohol/week: 0.0 standard drinks   Social History   Substance and Sexual Activity  Drug Use No    Allergies: No Known Allergies  Medications: Current Outpatient Medications  Medication Sig Dispense Refill   acetaminophen (TYLENOL) 500 MG tablet Take 500 mg by mouth every 6 (six) hours as needed.     albuterol (PROVENTIL) (2.5 MG/3ML) 0.083% nebulizer solution Take 3 mLs (2.5 mg total) by nebulization every 4 (four) hours as needed for wheezing or shortness of  breath. 75 mL 12   amLODipine (NORVASC) 2.5 MG tablet Take 1 tablet (2.5 mg total) by mouth daily. 90 tablet 1   aspirin EC 81 MG tablet Take 1 tablet (81 mg total) by mouth daily. 90 tablet 3   bisoprolol (ZEBETA) 5 MG tablet TAKE 1 TABLET BY MOUTH DAILY 30 tablet 3   budesonide ([name]) 0.25 MG/2ML nebulizer solution INHALE THE CONTENTS OF ONE VIAL TWICE DAILY 120 mL 5   dicyclomine (BENTYL) 10  MG capsule Take 1 capsule (10 mg total) by mouth every 12 (twelve) hours as needed (abdominal pain). 60 capsule 2   dorzolamide (TRUSOPT) 2 % ophthalmic solution Place 1 drop into both eyes 3 (three) times daily.     escitalopram (LEXAPRO) 10 MG tablet Take 10 mg by mouth daily.     isosorbide mononitrate (IMDUR) 30 MG 24 hr tablet Take 1 tablet (30 mg total) by mouth daily. 30 tablet 1   latanoprost (XALATAN) 0.005 % ophthalmic solution Place 1 drop into both eyes at bedtime.     levothyroxine (SYNTHROID) 25 MCG tablet Take 1 tablet by mouth daily.     meclizine (ANTIVERT) 25 MG tablet Take 1 tablet by mouth Twice daily as needed.     Melatonin 3 MG TABS Take 1 tablet by mouth at bedtime as needed.     Multiple Vitamins-Minerals (ICAPS AREDS 2) CAPS Take 1 tablet by mouth 2 (two) times daily.     OXYGEN 2lpm with sleep and as needed during the day DME- [facility]     pantoprazole (PROTONIX) 40 MG tablet TAKE 1 TABLET BY MOUTH TWICE DAILY 30-60 MINUTES BEFORE MEALS 60 tablet 3   pravastatin (PRAVACHOL) 20 MG tablet 1/2 tab by mouth once daily     PROAIR HFA 108 (90 Base) MCG/ACT inhaler inhale TWO puffs EVERY 4 HOURS AS NEEDED FOR wheezing OR SHORTNESS OF BREATH 8.5 g 2   Vitamin D, Ergocalciferol, (DRISDOL) 50000 UNITS CAPS capsule Take 50,000 Units by mouth every Wednesday.      No current facility-administered medications for this visit.    Review of Systems: GENERAL: negative for malaise, night sweats HEENT: No changes in hearing or vision, no nose bleeds or other nasal problems. NECK: Negative for lumps, goiter, pain and significant neck swelling RESPIRATORY: Negative for cough, wheezing CARDIOVASCULAR: Negative for chest pain, leg swelling, palpitations, orthopnea GI: SEE HPI MUSCULOSKELETAL: Negative for joint pain or swelling, back pain, and muscle pain. SKIN: Negative for lesions, rash PSYCH: Negative for sleep disturbance, mood disorder and recent psychosocial  stressors. HEMATOLOGY Negative for prolonged bleeding, bruising easily, and swollen nodes. ENDOCRINE: Negative for cold or heat intolerance, polyuria, polydipsia and goiter. NEURO: negative for tremor, gait imbalance, syncope and seizures. The remainder of the review of systems is noncontributory.   Physical Exam: BP 111/65 (BP Location: Left Arm, Patient Position: Sitting, Cuff Size: Large)   Pulse 71   Temp 99 F (37.2 C) (Oral)   Ht 4\' 11"  (1.499 m)   Wt 151 lb (68.5 kg)   BMI 30.50 kg/m  GENERAL: The patient is AO x3, in no acute distress. Elder  HEENT: Head is normocephalic and atraumatic. EOMI are intact. Mouth is well hydrated and without lesions. NECK: Supple. No masses LUNGS: Clear to auscultation. No presence of rhonchi/wheezing/rales. Adequate chest expansion HEART: RRR, normal s1 and s2. ABDOMEN: Soft, nontender, no guarding, no peritoneal signs, and nondistended. BS +. No masses. EXTREMITIES: Without any cyanosis, clubbing, rash, lesions or edema. NEUROLOGIC: AOx3, no focal  motor deficit. SKIN: no jaundice, no rashes  Imaging/Labs: as above  I personally reviewed and interpreted the available labs, imaging and endoscopic files.  Impression and Plan: Belinda [name] is a 87 y.o. female with past medical history of coronary artery disease status post stent placement, hypertension, GERD, hyperlipidemia who presents for follow up of episodes of diarrhea and choking.  The patient had self-limited episodes of diarrhea, likely it was functional in nature as it has resolved on its own without any further intervention.  Had negative infectious and metabolic investigations in the past.  For now, I will consider adequate to monitor and treat symptomatically with Imodium unless it recurs as this is most likely related to a functional etiology.  Regarding her choking episodes, these are likely related to pharyngeal component as they are intermittent.  We will start the investigations  for this with a modified barium swallow before considering an EGD.  - Schedule modified barium swallow - Can take Imodium as needed for diarrhea if more than 4 bowel movements per day  All questions were answered.      Dolores Frame, MD Gastroenterology and Hepatology [facility]

## 2020-08-25 NOTE — Patient Instructions (Signed)
Schedule modified barium swallow Can take Imodium as needed for diarrhea if more than 4 bowel movements per day

## 2020-08-29 ENCOUNTER — Other Ambulatory Visit (HOSPITAL_COMMUNITY): Payer: Self-pay | Admitting: Specialist

## 2020-08-29 DIAGNOSIS — K2100 Gastro-esophageal reflux disease with esophagitis, without bleeding: Secondary | ICD-10-CM

## 2020-08-29 DIAGNOSIS — R059 Cough, unspecified: Secondary | ICD-10-CM

## 2020-08-29 DIAGNOSIS — R1310 Dysphagia, unspecified: Secondary | ICD-10-CM

## 2020-09-05 ENCOUNTER — Other Ambulatory Visit: Payer: Self-pay

## 2020-09-05 ENCOUNTER — Ambulatory Visit (HOSPITAL_COMMUNITY)
Admission: RE | Admit: 2020-09-05 | Discharge: 2020-09-05 | Disposition: A | Discharge status: Home / Self Care | Payer: Medicare PPO | Source: Ambulatory Visit | Attending: Gastroenterology | Admitting: Gastroenterology

## 2020-09-05 ENCOUNTER — Encounter (HOSPITAL_COMMUNITY): Payer: Self-pay | Admitting: Speech Pathology

## 2020-09-05 ENCOUNTER — Ambulatory Visit (HOSPITAL_COMMUNITY): Payer: Medicare PPO | Attending: Gastroenterology | Admitting: Speech Pathology

## 2020-09-05 DIAGNOSIS — R059 Cough, unspecified: Secondary | ICD-10-CM | POA: Diagnosis not present

## 2020-09-05 DIAGNOSIS — R1310 Dysphagia, unspecified: Secondary | ICD-10-CM | POA: Insufficient documentation

## 2020-09-05 DIAGNOSIS — R6339 Other feeding difficulties: Secondary | ICD-10-CM | POA: Diagnosis not present

## 2020-09-05 DIAGNOSIS — K2100 Gastro-esophageal reflux disease with esophagitis, without bleeding: Secondary | ICD-10-CM | POA: Diagnosis not present

## 2020-09-05 NOTE — Therapy (Signed)
[facility] [address] [place], [place], [address] Phone: [phone]   Fax:  [phone]  Modified Barium Swallow  Patient Details  Name: Belinda [name] MRN: [id number] Date of Birth: 03/24/1925 No data recorded  Encounter Date: 09/05/2020   End of Session - 09/05/20 1102     Visit Number 1    Number of Visits 1    SLP Start Time 1005    SLP Stop Time  1034    SLP Time Calculation (min) 29 min    Activity Tolerance Patient tolerated treatment well            No data recorded  Past Medical History:  Past Medical History:  Diagnosis Date   Borderline diabetes    Coronary atherosclerosis of native coronary artery    DES LAD and circumflex 1/09, LVEF 70%   Essential hypertension    GERD (gastroesophageal reflux disease)    Glaucoma    Mixed hyperlipidemia    Overactive bladder    Past Surgical History:  Past Surgical History:  Procedure Laterality Date   APPENDECTOMY     BACK SURGERY     CHOLECYSTECTOMY     COLONOSCOPY  07/15/2011   Procedure: COLONOSCOPY;  Surgeon: Malissa Hippo, MD;  [facility];  Service: Endoscopy;  Laterality: N/A;  930   HERNIA REPAIR     TONSILLECTOMY     HPI: Belinda [name] is a 87 y.o. female with past medical history of coronary artery disease status post stent placement, hypertension, GERD, hyperlipidemia who presented to GI 08/25/20 for follow up of episodes of diarrhea and choking. She reported to GI at that time that she "has felt some choking episodes when swallowing food, only with solid food. States this has been going for a little longer than a year. Sometimes she coughs the food and it resolves on its own". MBSS requested.   No data recorded   Assessment / Plan / Recommendation  CHL IP CLINICAL IMPRESSIONS 09/05/2020  Clinical Impression Pt presents with oropharyngeal swallowing today to be largely [facility]. No aspiration was visualized; Pt did demonstrate trace flash  penetration of thin liquids with tsp sips, however, this is likely an age related difference in swallowing. No penetration was visualized with cup sips or straw sips of thin liquids. Pt consumed puree and regular textures without residue. Pt with good hyolaryngeal excursion and good laryngeal vestibule closure. Esophageal sweep of barium tablet administration with thin liquids revealed standing column of barium, however with an additional tsp bite of puree bolus and tablet passed through without difficulty - radiologist present to confirm. Pt reported that if cracker had not been coated in barium that she likely would have had difficulty or gotten "choked"; she reports choking with dry textures. Recommend continue with soft/moist diet (avoid or moisten problematic consistencies e.g. cornbread, dry crackers, etc.). Also note Pt is on O2 and demonstrated some shortness of breath with mobility and with PO trials; SLP reviewed strategies for energy conservation and slow rate to ensure safe coordination of swallowing and respiration. There are no ST f/u needs recommended at this time. Results and recommendations reviewed with Pt and Pt's daughter after the MBSS today. Thank you for this referral.  SLP Visit Diagnosis Dysphagia, unspecified (R13.10)  Attention and concentration deficit following --  Frontal lobe and executive function deficit following --  Impact on safety and function Mild aspiration risk      No flowsheet data found.  No flowsheet data found.  CHL IP DIET RECOMMENDATION 09/05/2020  SLP Diet Recommendations Dysphagia 3 (Mech soft) solids;Thin liquid  Liquid Administration via Straw;Cup  Medication Administration Whole meds with liquid  Compensations Minimize environmental distractions;Slow rate;Small sips/bites;Follow solids with liquid  Postural Changes Remain semi-upright after after feeds/meals (Comment);Seated upright at 90 degrees      CHL IP OTHER RECOMMENDATIONS 09/05/2020   Recommended Consults --  Oral Care Recommendations Oral care BID  Other Recommendations --      No flowsheet data found.    No flowsheet data found.         CHL IP ORAL PHASE 09/05/2020  Oral Phase WFL  Oral - Pudding Teaspoon --  Oral - Pudding Cup --  Oral - Honey Teaspoon --  Oral - Honey Cup --  Oral - Nectar Teaspoon --  Oral - Nectar Cup --  Oral - Nectar Straw --  Oral - Thin Teaspoon --  Oral - Thin Cup --  Oral - Thin Straw --  Oral - Puree --  Oral - Mech Soft --  Oral - Regular --  Oral - Multi-Consistency --  Oral - Pill --  Oral Phase - Comment --    CHL IP PHARYNGEAL PHASE 09/05/2020  Pharyngeal Phase WFL  Pharyngeal- Pudding Teaspoon --  Pharyngeal --  Pharyngeal- Pudding Cup --  Pharyngeal --  Pharyngeal- Honey Teaspoon --  Pharyngeal --  Pharyngeal- Honey Cup --  Pharyngeal --  Pharyngeal- Nectar Teaspoon --  Pharyngeal --  Pharyngeal- Nectar Cup --  Pharyngeal --  Pharyngeal- Nectar Straw --  Pharyngeal --  Pharyngeal- Thin Teaspoon --  Pharyngeal --  Pharyngeal- Thin Cup --  Pharyngeal --  Pharyngeal- Thin Straw --  Pharyngeal --  Pharyngeal- Puree --  Pharyngeal --  Pharyngeal- Mechanical Soft --  Pharyngeal --  Pharyngeal- Regular --  Pharyngeal --  Pharyngeal- Multi-consistency --  Pharyngeal --  Pharyngeal- Pill --  Pharyngeal --  Pharyngeal Comment --     CHL IP CERVICAL ESOPHAGEAL PHASE 09/05/2020  Cervical Esophageal Phase WFL  Pudding Teaspoon --  Pudding Cup --  Honey Teaspoon --  Honey Cup --  Nectar Teaspoon --  Nectar Cup --  Nectar Straw --  Thin Teaspoon --  Thin Cup --  Thin Straw --  Puree --  Mechanical Soft --  Regular --  Multi-consistency --  Pill --  Cervical Esophageal Comment --    Ludwig Tugwell H. Romie Levee, CCC-SLP Speech Language Pathologist  Georgetta Haber 09/05/2020, 11:03 AM                         Georgetta Haber 09/05/2020, 11:03 AM  [place] [facility] [address] [place], [place], [address] Phone: [phone]   Fax:  [phone]  Name: DORIANA [name] MRN: [id number] Date of Birth: 10-16-1925

## 2020-10-18 DIAGNOSIS — I25119 Atherosclerotic heart disease of native coronary artery with unspecified angina pectoris: Secondary | ICD-10-CM | POA: Diagnosis not present

## 2020-10-18 DIAGNOSIS — G8929 Other chronic pain: Secondary | ICD-10-CM | POA: Diagnosis not present

## 2020-10-18 DIAGNOSIS — J9611 Chronic respiratory failure with hypoxia: Secondary | ICD-10-CM | POA: Diagnosis not present

## 2020-10-18 DIAGNOSIS — G629 Polyneuropathy, unspecified: Secondary | ICD-10-CM | POA: Diagnosis not present

## 2020-10-18 DIAGNOSIS — E785 Hyperlipidemia, unspecified: Secondary | ICD-10-CM | POA: Diagnosis not present

## 2020-10-18 DIAGNOSIS — I739 Peripheral vascular disease, unspecified: Secondary | ICD-10-CM | POA: Diagnosis not present

## 2020-10-18 DIAGNOSIS — H409 Unspecified glaucoma: Secondary | ICD-10-CM | POA: Diagnosis not present

## 2020-10-18 DIAGNOSIS — E039 Hypothyroidism, unspecified: Secondary | ICD-10-CM | POA: Diagnosis not present

## 2020-10-18 DIAGNOSIS — J449 Chronic obstructive pulmonary disease, unspecified: Secondary | ICD-10-CM | POA: Diagnosis not present

## 2020-11-17 DIAGNOSIS — Z6832 Body mass index (BMI) 32.0-32.9, adult: Secondary | ICD-10-CM | POA: Diagnosis not present

## 2020-11-17 DIAGNOSIS — Z299 Encounter for prophylactic measures, unspecified: Secondary | ICD-10-CM | POA: Diagnosis not present

## 2020-11-17 DIAGNOSIS — R350 Frequency of micturition: Secondary | ICD-10-CM | POA: Diagnosis not present

## 2020-11-17 DIAGNOSIS — I10 Essential (primary) hypertension: Secondary | ICD-10-CM | POA: Diagnosis not present

## 2020-11-17 DIAGNOSIS — N390 Urinary tract infection, site not specified: Secondary | ICD-10-CM | POA: Diagnosis not present

## 2020-11-17 DIAGNOSIS — Z23 Encounter for immunization: Secondary | ICD-10-CM | POA: Diagnosis not present

## 2020-11-19 ENCOUNTER — Telehealth: Payer: Self-pay

## 2020-11-19 ENCOUNTER — Ambulatory Visit: Payer: Medicare PPO | Admitting: Internal Medicine

## 2020-11-19 ENCOUNTER — Encounter: Payer: Self-pay | Admitting: Internal Medicine

## 2020-11-19 ENCOUNTER — Other Ambulatory Visit: Payer: Self-pay

## 2020-11-19 DIAGNOSIS — J45901 Unspecified asthma with (acute) exacerbation: Secondary | ICD-10-CM | POA: Diagnosis not present

## 2020-11-19 DIAGNOSIS — J9611 Chronic respiratory failure with hypoxia: Secondary | ICD-10-CM

## 2020-11-19 DIAGNOSIS — J441 Chronic obstructive pulmonary disease with (acute) exacerbation: Secondary | ICD-10-CM | POA: Diagnosis not present

## 2020-11-19 DIAGNOSIS — R0609 Other forms of dyspnea: Secondary | ICD-10-CM | POA: Diagnosis not present

## 2020-11-19 NOTE — Telephone Encounter (Signed)
Patient called and stated she no longer needs nebulizer machine. MW notified verbally.

## 2020-11-19 NOTE — Assessment & Plan Note (Addendum)
/  2020 Patient saturation on room air at rest = 94% >>>  while ambulating = 84% x 125 ft slow pace >  Placed on 2 liters of O2 while ambulating = 97% - 08/09/2018  Did not desaturate walking 250 ft though was sob  - 04/09/2019   Walked RA x one lap =  approx 250 ft -@ slow pace stopped due to 86%     As of 11/19/2020  rec 2lpm hs and prn daytime if sats dip < 90% with activity  Reminded: Make sure you check your oxygen saturation  at your highest level of activity  to be sure it stays over 90% and adjust  02 flow upward to maintain this level if needed but remember to turn it back to previous settings when you stop (to conserve your supply).           Each maintenance medication was reviewed in detail including emphasizing most importantly the difference between maintenance and prns and under what circumstances the prns are to be triggered using an action plan format where appropriate.  Total time for H and P, chart review, counseling, reviewing neb/02 device(s) and generating customized AVS unique to this office visit / same day charting = 32 min

## 2020-11-19 NOTE — Assessment & Plan Note (Addendum)
Onset ? Around 2009  - Allergy profile 07/27/17  >  Eos 0.1 /  IgE  4 RAST neg  - spirometry 08/23/17 nl x mild concavity to f/v loop  - 04/09/2019 off brovana x one month with no increase doe or need for saba so left off it but continue on pulmicort 0.25 mg bid  > free of limiting symptoms 11/02/2019  - 11/19/2020 added back bid albuterol with each bud   Not clear this is really asthma related sob in absence of increase cough/ wheeze or noct symptoms but for now rec at least add back saba bid and prn   Re SABA :  I spent extra time with pt today reviewing appropriate use of albuterol for prn use on exertion with the following points: 1) saba is for relief of sob that does not improve by walking a slower pace or resting but rather if the pt does not improve after trying this first. 2) If the pt is convinced, as many are, that saba helps recover from activity faster then it's easy to tell if this is the case by re-challenging : ie stop, take the inhaler, then p 5 minutes try the exact same activity (intensity of workload) that just caused the symptoms and see if they are substantially diminished or not after saba 3) if there is an activity that reproducibly causes the symptoms, try the saba 15 min before the activity on alternate days   If in fact the saba really does help, then fine to continue to use it prn but advised may need to look closer at the maintenance regimen being used to achieve better control of airways disease with exertion.   If clearly better with saba then reconsider laba/ics

## 2020-11-19 NOTE — Patient Instructions (Addendum)
Plan A = Automatic = Always=    albuterol 2.5 mg with budesonide 0.25 mg 1st thing in am and 12 hours later  Plan B = Backup (to supplement plan A, not to replace it) Only use your albuterol nebulizer as a rescue medication to be used if you can't catch your breath by resting or doing a relaxed purse lip breathing pattern.  - The less you use it, the better it will work when you need it. - Ok to use the inhaler up to  every 4 hours if you must but call for appointment if use goes up over your usual need - Don't leave home without it !!  (think of it like the spare tire for your car)   Ok to try albuterol 15 min before an activity (on alternating days)  that you know would usually make you short of breath and see if it makes any difference and if makes none then don't take albuterol after activity unless you can't catch your breath as this means it's the resting that helps, not the albuterol.  Make sure you check your oxygen saturation  at your highest level of activity  to be sure it stays over 90% and adjust  02 flow upward to maintain this level if needed but remember to turn it back to previous settings when you stop (to conserve your supply).      Please schedule a follow up visit in  6 months but call sooner if needed

## 2020-11-19 NOTE — Progress Notes (Signed)
Subjective:     Patient ID: Belinda [name], female   DOB: 1925/06/09,   MRN: [id number]  Brief patient profile:  53  yowf  Quit smoking age 87 with seasonal symptoms of coughing sneezing worse since onset breathing problem around 2009  > cardiac eval :  DES to LAD and Cx 2009 with preserved LV fxn as of 2014.    History of Present Illness  07/27/2017 1st [name] visit/ Belinda [name]   Chief Complaint  Patient presents with   Pulmonary Consult    Referred by Belinda [name] for eval of COPD.  Pt c/o SOB for the past 5-6 yrs, worse for the past 2 years. She states that she gets SOB "when I move alot".  She has had cough for at least the past month- prod with yellow to clear sputum. She states she often gets "choked up"  wearing 02 hs x sev years and sleeps ok on one pillow but p stirring  Every day x years now starts with coughing/choking worse with eating also and sob with minimal activity. Did not improve on symbicort x  Several year Belinda [name] changed to trelegy x 6 monts prior to OV   [facility] and Or[facility] Aloe do not feel she has copd per Daughter  rec I strongly recommend your eye doctor consider an alternative to your timolol eyedrops as they can cause cough and wheeze Stop atenolol and replace bisoprolol 5 mg one daily (bag #1)  Stop trelegy and fish oil  Change pantoprazole to 40 mg Take 30- 60 min before your first and last meals of the day  GERD   Plan A = Automatic = Brovana 15 mcg twice daily along budesonide 0.25 mg (Bag #1)  Plan B = Backup (Bag #2)  Only use your albuterol as a rescue medication  For cough > take your cough syrup (bag # 2)  08/23/17 NP Med calendar provided and rec use it and return to ov with it in hand    08/09/2018  f/u ov/Belinda [name] re: asthma/ chronic resp failure, did not bring meds or 02  Chief Complaint  Patient presents with   Follow-up    Breathing is about the same. She has been wheezing more. She is not using her albuterol inhaler.   Dyspnea:  Mailbox  and back s stopping is her goal  Cough: not much, gets choked some with food/ already on  on ppi but not bid ac consistently  Sleeping: 30 degrees does ok  SABA use: none  02: 2lpm hs and not using at all daytime - "left it in car"  rec Protonix 40 mg Take 30- 60 min before your first and last meals of the day  Continue 02 2lpm at bedtime and only use daytime to keep sats over 90% if needed   11/06/2018 NP  rec Continue on current regimen .  Continue on Oxygen 2l/m At bedtime  .  Continue on Budesonide and Brovana Neb Twice daily    hosp with covid 19 pna over [place] 2021 > back to baseline    11/02/2019  f/u ov/Belinda [name] re: asthma just on budesonide bid Chief Complaint  Patient presents with   Follow-up    shortness of breath with exertion  Dyspnea:  Less active due to balance with breathing not limiting  Cough: not much  At all  Sleeping: bed is flat / one pillow  SABA use: proair maybe once a week/ never neb saba 02: 2 hs / not much  during the day  Rec Make sure you check your oxygen saturations at highest level of activity      11/19/2020  f/u ov/Belinda [name]/Belinda [name] re: asthma/ resp failure maint on 02 hs and prn  daytime plus bud bid but no formoterol   Chief Complaint  Patient presents with   Follow-up    SOB has worsened since last OV.Using oxygen at home and during the day as needed. 2L O2 cont.    Dyspnea:  house bound, no sopping  Cough: none  Sleeping: flat bed / one pillow  SABA use: neb helps some  but not really using much  02: 2 lpm hs and not using daytime  Covid status: vax x 2     No obvious day to day or daytime variability or assoc excess/ purulent sputum or mucus plugs or hemoptysis or cp or chest tightness, subjective wheeze or overt sinus or hb symptoms.   Sleeping as above  without nocturnal  or early am exacerbation  of respiratory  c/o's or need for noct saba. Also denies any obvious fluctuation of symptoms with weather or  environmental changes or other aggravating or alleviating factors except as outlined above   No unusual exposure hx or h/o childhood pna/ asthma or knowledge of premature birth.  Current Allergies, Complete Past Medical History, Past Surgical History, Family History, and Social History were reviewed in [employer] record.  ROS  The following are not active complaints unless bolded Hoarseness, sore throat, dysphagia, dental problems, itching, sneezing,  nasal congestion or discharge of excess mucus or purulent secretions, ear ache,   fever, chills, sweats, unintended wt loss or wt gain, classically pleuritic or exertional cp,  orthopnea pnd or arm/hand swelling  or leg swelling, presyncope, palpitations, abdominal pain, anorexia, nausea, vomiting, diarrhea  or change in bowel habits or change in bladder habits, change in stools or change in urine, dysuria, hematuria,  rash, arthralgias, visual complaints, headache, numbness, weakness or ataxia or problems with walking /rollator at home or coordination,  change in mood or  memory.        Current Meds  Medication Sig   acetaminophen (TYLENOL) 500 MG tablet Take 500 mg by mouth every 6 (six) hours as needed.   albuterol (PROVENTIL) (2.5 MG/3ML) 0.083% nebulizer solution Take 3 mLs (2.5 mg total) by nebulization every 4 (four) hours as needed for wheezing or shortness of breath.   aspirin EC 81 MG tablet Take 1 tablet (81 mg total) by mouth daily.   bisoprolol (ZEBETA) 5 MG tablet TAKE 1 TABLET BY MOUTH DAILY   budesonide ([name]) 0.25 MG/2ML nebulizer solution INHALE THE CONTENTS OF ONE VIAL TWICE DAILY   dicyclomine (BENTYL) 10 MG capsule Take 1 capsule (10 mg total) by mouth every 12 (twelve) hours as needed (abdominal pain).   dorzolamide (TRUSOPT) 2 % ophthalmic solution Place 1 drop into both eyes 3 (three) times daily.   isosorbide mononitrate (IMDUR) 30 MG 24 hr tablet Take 1 tablet (30 mg total) by mouth daily.    latanoprost (XALATAN) 0.005 % ophthalmic solution Place 1 drop into both eyes at bedtime.   levothyroxine (SYNTHROID) 25 MCG tablet Take 1 tablet by mouth daily.   meclizine (ANTIVERT) 25 MG tablet Take 1 tablet by mouth Twice daily as needed.   Melatonin 3 MG TABS Take 1 tablet by mouth at bedtime as needed.   Multiple Vitamins-Minerals (ICAPS AREDS 2) CAPS Take 1 tablet by mouth 2 (two) times daily.   OXYGEN  2lpm with sleep and as needed during the day DME- [facility]   pantoprazole (PROTONIX) 40 MG tablet TAKE 1 TABLET BY MOUTH TWICE DAILY 30-60 MINUTES BEFORE MEALS   pravastatin (PRAVACHOL) 20 MG tablet 1/2 tab by mouth once daily   PROAIR HFA 108 (90 Base) MCG/ACT inhaler inhale TWO puffs EVERY 4 HOURS AS NEEDED FOR wheezing OR SHORTNESS OF BREATH   Vitamin D, Ergocalciferol, (DRISDOL) 50000 UNITS CAPS capsule Take 50,000 Units by mouth every Wednesday.               Objective:   Physical Exam  11/19/2020         158  11/02/2019        152  04/09/2019          156  08/09/2018          159  06/28/2018        162  10/24/2017       159   07/27/17 153 lb (69.4 kg)  12/10/16 159 lb 12.8 oz (72.5 kg)  04/20/16 156 lb 12.8 oz (71.1 kg)     Vital signs reviewed  11/19/2020  - Note at rest 02 sats  94% on RA    General appearance:    hoarse wf very hard of hearing/ 4 pronged walking stick  HEENT : pt wearing mask not removed for exam due to covid - 19 concerns.   NECK :  without JVD/Nodes/TM/ nl carotid upstrokes bilaterally   LUNGS: no acc muscle use,  Min barrel  contour chest wall with bilateral  slightly decreased bs s audible wheeze and  without cough on insp or exp maneuvers and min  Hyperresonant  to  percussion bilaterally     CV:  RRR  no s3 or murmur or increase in P2, and no edema   ABD:  soft and nontender with pos end  insp Hoover's  in the supine position. No bruits or organomegaly appreciated, bowel sounds nl  MS:   slow gait/ ext warm without deformities, calf  tenderness, cyanosis or clubbing No obvious joint restrictions   SKIN: warm and dry without lesions    NEURO:  alert, approp, nl sensorium with  no motor or cerebellar deficits apparent.           Assessment:

## 2020-11-20 DIAGNOSIS — K922 Gastrointestinal hemorrhage, unspecified: Secondary | ICD-10-CM | POA: Diagnosis not present

## 2020-11-20 DIAGNOSIS — K760 Fatty (change of) liver, not elsewhere classified: Secondary | ICD-10-CM | POA: Diagnosis not present

## 2020-11-20 DIAGNOSIS — Z20822 Contact with and (suspected) exposure to covid-19: Secondary | ICD-10-CM | POA: Diagnosis not present

## 2020-11-20 DIAGNOSIS — Z9049 Acquired absence of other specified parts of digestive tract: Secondary | ICD-10-CM | POA: Diagnosis not present

## 2020-11-20 DIAGNOSIS — I700 Atherosclerosis of aorta: Secondary | ICD-10-CM | POA: Diagnosis not present

## 2020-11-20 DIAGNOSIS — R531 Weakness: Secondary | ICD-10-CM | POA: Diagnosis not present

## 2020-11-20 DIAGNOSIS — R1032 Left lower quadrant pain: Secondary | ICD-10-CM | POA: Diagnosis not present

## 2020-11-20 DIAGNOSIS — I10 Essential (primary) hypertension: Secondary | ICD-10-CM | POA: Diagnosis not present

## 2020-11-20 DIAGNOSIS — Z7982 Long term (current) use of aspirin: Secondary | ICD-10-CM | POA: Diagnosis not present

## 2020-11-20 DIAGNOSIS — K5730 Diverticulosis of large intestine without perforation or abscess without bleeding: Secondary | ICD-10-CM | POA: Diagnosis not present

## 2020-11-20 LAB — SEDIMENTATION RATE: Sed Rate: 37 mm/hr (ref 0–40)

## 2020-11-20 LAB — CBC WITH DIFFERENTIAL/PLATELET
Basophils Absolute: 0.1 10*3/uL (ref 0.0–0.2)
Basos: 1 %
EOS (ABSOLUTE): 0.1 10*3/uL (ref 0.0–0.4)
Eos: 2 %
Hematocrit: 33.9 % — ABNORMAL LOW (ref 34.0–46.6)
Hemoglobin: 11.1 g/dL (ref 11.1–15.9)
Immature Grans (Abs): 0 10*3/uL (ref 0.0–0.1)
Immature Granulocytes: 0 %
Lymphocytes Absolute: 1.4 10*3/uL (ref 0.7–3.1)
Lymphs: 23 %
MCH: 28.9 pg (ref 26.6–33.0)
MCHC: 32.7 g/dL (ref 31.5–35.7)
MCV: 88 fL (ref 79–97)
Monocytes Absolute: 0.5 10*3/uL (ref 0.1–0.9)
Monocytes: 8 %
Neutrophils Absolute: 3.9 10*3/uL (ref 1.4–7.0)
Neutrophils: 66 %
Platelets: 166 10*3/uL (ref 150–450)
RBC: 3.84 x10E6/uL (ref 3.77–5.28)
RDW: 15.6 % — ABNORMAL HIGH (ref 11.7–15.4)
WBC: 6 10*3/uL (ref 3.4–10.8)

## 2020-11-20 LAB — BASIC METABOLIC PANEL
BUN/Creatinine Ratio: 14 (ref 12–28)
BUN: 18 mg/dL (ref 10–36)
CO2: 21 mmol/L (ref 20–29)
Calcium: 8.7 mg/dL (ref 8.7–10.3)
Chloride: 105 mmol/L (ref 96–106)
Creatinine, Ser: 1.26 mg/dL — ABNORMAL HIGH (ref 0.57–1.00)
Glucose: 121 mg/dL — ABNORMAL HIGH (ref 70–99)
Potassium: 5.1 mmol/L (ref 3.5–5.2)
Sodium: 141 mmol/L (ref 134–144)
eGFR: 40 mL/min/{1.73_m2} — ABNORMAL LOW (ref >59)

## 2020-11-20 LAB — BRAIN NATRIURETIC PEPTIDE: BNP: 62.5 pg/mL (ref 0.0–100.0)

## 2020-11-20 LAB — D-DIMER, QUANTITATIVE: D-DIMER: 0.85 mg/L FEU — ABNORMAL HIGH (ref 0.00–0.49)

## 2020-11-20 LAB — TSH: TSH: 3.36 u[IU]/mL (ref 0.450–4.500)

## 2020-11-21 ENCOUNTER — Encounter: Payer: Self-pay | Admitting: *Deleted

## 2020-11-21 DIAGNOSIS — J449 Chronic obstructive pulmonary disease, unspecified: Secondary | ICD-10-CM | POA: Diagnosis not present

## 2020-11-21 DIAGNOSIS — I10 Essential (primary) hypertension: Secondary | ICD-10-CM | POA: Diagnosis not present

## 2020-11-21 DIAGNOSIS — Z6832 Body mass index (BMI) 32.0-32.9, adult: Secondary | ICD-10-CM | POA: Diagnosis not present

## 2020-11-21 DIAGNOSIS — Z299 Encounter for prophylactic measures, unspecified: Secondary | ICD-10-CM | POA: Diagnosis not present

## 2020-11-21 DIAGNOSIS — R350 Frequency of micturition: Secondary | ICD-10-CM | POA: Diagnosis not present

## 2020-11-21 DIAGNOSIS — K922 Gastrointestinal hemorrhage, unspecified: Secondary | ICD-10-CM | POA: Diagnosis not present

## 2020-11-21 NOTE — Progress Notes (Signed)
Letter mailed

## 2020-11-22 ENCOUNTER — Encounter: Payer: Self-pay | Admitting: Internal Medicine

## 2020-11-23 ENCOUNTER — Other Ambulatory Visit: Payer: Self-pay | Admitting: Internal Medicine

## 2020-11-23 ENCOUNTER — Other Ambulatory Visit: Payer: Self-pay | Admitting: Cardiology

## 2020-11-26 DIAGNOSIS — K922 Gastrointestinal hemorrhage, unspecified: Secondary | ICD-10-CM | POA: Diagnosis not present

## 2020-12-01 DIAGNOSIS — H401112 Primary open-angle glaucoma, right eye, moderate stage: Secondary | ICD-10-CM | POA: Diagnosis not present

## 2020-12-03 ENCOUNTER — Telehealth: Payer: Self-pay | Admitting: Gastroenterology

## 2020-12-03 NOTE — Telephone Encounter (Signed)
Good morning Dr. Orvan Falconer, we received a referral for patient to be seen by you.  She was recently seen at [facility] but not happy with care there and would like to transfer to [facility].  Records are in Epic.  Can you please review and advise on scheduling?  Thank you.

## 2020-12-05 NOTE — Telephone Encounter (Signed)
Left voice mail to call back to inform.

## 2020-12-05 NOTE — Telephone Encounter (Signed)
Request received to transfer GI care from outside practice to [facility].  We appreciate the interest in our practice, however at this time due to high demand from patients without established GI providers we cannot accommodate this transfer.  Ability to accommodate future transfer requests may change over time and the patient can contact us again in 6-12 months if still interested in being seen at [facility].      °

## 2020-12-19 DIAGNOSIS — I10 Essential (primary) hypertension: Secondary | ICD-10-CM | POA: Diagnosis not present

## 2020-12-19 DIAGNOSIS — J449 Chronic obstructive pulmonary disease, unspecified: Secondary | ICD-10-CM | POA: Diagnosis not present

## 2020-12-19 DIAGNOSIS — Z6832 Body mass index (BMI) 32.0-32.9, adult: Secondary | ICD-10-CM | POA: Diagnosis not present

## 2020-12-19 DIAGNOSIS — R350 Frequency of micturition: Secondary | ICD-10-CM | POA: Diagnosis not present

## 2020-12-19 DIAGNOSIS — Z299 Encounter for prophylactic measures, unspecified: Secondary | ICD-10-CM | POA: Diagnosis not present

## 2020-12-19 DIAGNOSIS — M549 Dorsalgia, unspecified: Secondary | ICD-10-CM | POA: Diagnosis not present

## 2020-12-22 DIAGNOSIS — M2578 Osteophyte, vertebrae: Secondary | ICD-10-CM | POA: Diagnosis not present

## 2020-12-22 DIAGNOSIS — E559 Vitamin D deficiency, unspecified: Secondary | ICD-10-CM | POA: Diagnosis not present

## 2020-12-22 DIAGNOSIS — M545 Low back pain, unspecified: Secondary | ICD-10-CM | POA: Diagnosis not present

## 2020-12-22 DIAGNOSIS — M810 Age-related osteoporosis without current pathological fracture: Secondary | ICD-10-CM | POA: Diagnosis not present

## 2020-12-22 DIAGNOSIS — R5383 Other fatigue: Secondary | ICD-10-CM | POA: Diagnosis not present

## 2020-12-22 DIAGNOSIS — M8588 Other specified disorders of bone density and structure, other site: Secondary | ICD-10-CM | POA: Diagnosis not present

## 2020-12-22 DIAGNOSIS — M25559 Pain in unspecified hip: Secondary | ICD-10-CM | POA: Diagnosis not present

## 2020-12-22 DIAGNOSIS — M25551 Pain in right hip: Secondary | ICD-10-CM | POA: Diagnosis not present

## 2020-12-22 DIAGNOSIS — R102 Pelvic and perineal pain: Secondary | ICD-10-CM | POA: Diagnosis not present

## 2021-01-14 DIAGNOSIS — E559 Vitamin D deficiency, unspecified: Secondary | ICD-10-CM | POA: Diagnosis not present

## 2021-01-14 DIAGNOSIS — Z299 Encounter for prophylactic measures, unspecified: Secondary | ICD-10-CM | POA: Diagnosis not present

## 2021-01-14 DIAGNOSIS — Z1331 Encounter for screening for depression: Secondary | ICD-10-CM | POA: Diagnosis not present

## 2021-01-14 DIAGNOSIS — E039 Hypothyroidism, unspecified: Secondary | ICD-10-CM | POA: Diagnosis not present

## 2021-01-14 DIAGNOSIS — Z0000 Encounter for general adult medical examination without abnormal findings: Secondary | ICD-10-CM | POA: Diagnosis not present

## 2021-01-14 DIAGNOSIS — E7800 Pure hypercholesterolemia, unspecified: Secondary | ICD-10-CM | POA: Diagnosis not present

## 2021-01-14 DIAGNOSIS — D509 Iron deficiency anemia, unspecified: Secondary | ICD-10-CM | POA: Diagnosis not present

## 2021-01-14 DIAGNOSIS — Z7189 Other specified counseling: Secondary | ICD-10-CM | POA: Diagnosis not present

## 2021-01-14 DIAGNOSIS — Z79899 Other long term (current) drug therapy: Secondary | ICD-10-CM | POA: Diagnosis not present

## 2021-01-14 DIAGNOSIS — Z1339 Encounter for screening examination for other mental health and behavioral disorders: Secondary | ICD-10-CM | POA: Diagnosis not present

## 2021-01-14 DIAGNOSIS — R5383 Other fatigue: Secondary | ICD-10-CM | POA: Diagnosis not present

## 2021-01-14 DIAGNOSIS — Z6831 Body mass index (BMI) 31.0-31.9, adult: Secondary | ICD-10-CM | POA: Diagnosis not present

## 2021-01-14 DIAGNOSIS — I10 Essential (primary) hypertension: Secondary | ICD-10-CM | POA: Diagnosis not present

## 2021-01-20 DIAGNOSIS — R350 Frequency of micturition: Secondary | ICD-10-CM | POA: Diagnosis not present

## 2021-01-20 DIAGNOSIS — N390 Urinary tract infection, site not specified: Secondary | ICD-10-CM | POA: Diagnosis not present

## 2021-01-20 DIAGNOSIS — Z299 Encounter for prophylactic measures, unspecified: Secondary | ICD-10-CM | POA: Diagnosis not present

## 2021-01-20 DIAGNOSIS — Z789 Other specified health status: Secondary | ICD-10-CM | POA: Diagnosis not present

## 2021-02-18 DIAGNOSIS — H4089 Other specified glaucoma: Secondary | ICD-10-CM | POA: Diagnosis not present

## 2021-02-18 DIAGNOSIS — H04123 Dry eye syndrome of bilateral lacrimal glands: Secondary | ICD-10-CM | POA: Diagnosis not present

## 2021-03-03 DIAGNOSIS — H4089 Other specified glaucoma: Secondary | ICD-10-CM | POA: Diagnosis not present

## 2021-03-17 DIAGNOSIS — J449 Chronic obstructive pulmonary disease, unspecified: Secondary | ICD-10-CM | POA: Diagnosis not present

## 2021-03-17 DIAGNOSIS — J069 Acute upper respiratory infection, unspecified: Secondary | ICD-10-CM | POA: Diagnosis not present

## 2021-03-17 DIAGNOSIS — Z789 Other specified health status: Secondary | ICD-10-CM | POA: Diagnosis not present

## 2021-03-17 DIAGNOSIS — I10 Essential (primary) hypertension: Secondary | ICD-10-CM | POA: Diagnosis not present

## 2021-03-17 DIAGNOSIS — N183 Chronic kidney disease, stage 3 unspecified: Secondary | ICD-10-CM | POA: Diagnosis not present

## 2021-03-17 DIAGNOSIS — Z299 Encounter for prophylactic measures, unspecified: Secondary | ICD-10-CM | POA: Diagnosis not present

## 2021-03-17 DIAGNOSIS — I700 Atherosclerosis of aorta: Secondary | ICD-10-CM | POA: Diagnosis not present

## 2021-03-22 ENCOUNTER — Other Ambulatory Visit: Payer: Self-pay | Admitting: Internal Medicine

## 2021-03-26 ENCOUNTER — Other Ambulatory Visit: Payer: Self-pay | Admitting: Internal Medicine

## 2021-03-26 DIAGNOSIS — Z299 Encounter for prophylactic measures, unspecified: Secondary | ICD-10-CM | POA: Diagnosis not present

## 2021-03-26 DIAGNOSIS — M5134 Other intervertebral disc degeneration, thoracic region: Secondary | ICD-10-CM | POA: Diagnosis not present

## 2021-03-26 DIAGNOSIS — Z6831 Body mass index (BMI) 31.0-31.9, adult: Secondary | ICD-10-CM | POA: Diagnosis not present

## 2021-03-26 DIAGNOSIS — I10 Essential (primary) hypertension: Secondary | ICD-10-CM | POA: Diagnosis not present

## 2021-03-26 DIAGNOSIS — R059 Cough, unspecified: Secondary | ICD-10-CM | POA: Diagnosis not present

## 2021-03-26 DIAGNOSIS — J9611 Chronic respiratory failure with hypoxia: Secondary | ICD-10-CM | POA: Diagnosis not present

## 2021-03-26 DIAGNOSIS — J441 Chronic obstructive pulmonary disease with (acute) exacerbation: Secondary | ICD-10-CM | POA: Diagnosis not present

## 2021-04-03 DIAGNOSIS — J449 Chronic obstructive pulmonary disease, unspecified: Secondary | ICD-10-CM | POA: Diagnosis not present

## 2021-04-03 DIAGNOSIS — Z789 Other specified health status: Secondary | ICD-10-CM | POA: Diagnosis not present

## 2021-04-03 DIAGNOSIS — N183 Chronic kidney disease, stage 3 unspecified: Secondary | ICD-10-CM | POA: Diagnosis not present

## 2021-04-03 DIAGNOSIS — Z299 Encounter for prophylactic measures, unspecified: Secondary | ICD-10-CM | POA: Diagnosis not present

## 2021-04-03 DIAGNOSIS — I700 Atherosclerosis of aorta: Secondary | ICD-10-CM | POA: Diagnosis not present

## 2021-04-03 DIAGNOSIS — J9611 Chronic respiratory failure with hypoxia: Secondary | ICD-10-CM | POA: Diagnosis not present

## 2021-04-03 DIAGNOSIS — Z6831 Body mass index (BMI) 31.0-31.9, adult: Secondary | ICD-10-CM | POA: Diagnosis not present

## 2021-04-06 DIAGNOSIS — J029 Acute pharyngitis, unspecified: Secondary | ICD-10-CM | POA: Diagnosis not present

## 2021-04-06 DIAGNOSIS — R1310 Dysphagia, unspecified: Secondary | ICD-10-CM | POA: Diagnosis not present

## 2021-04-06 DIAGNOSIS — I700 Atherosclerosis of aorta: Secondary | ICD-10-CM | POA: Diagnosis not present

## 2021-04-06 DIAGNOSIS — R590 Localized enlarged lymph nodes: Secondary | ICD-10-CM | POA: Diagnosis not present

## 2021-04-06 DIAGNOSIS — J069 Acute upper respiratory infection, unspecified: Secondary | ICD-10-CM | POA: Diagnosis not present

## 2021-04-06 DIAGNOSIS — R4702 Dysphasia: Secondary | ICD-10-CM | POA: Diagnosis not present

## 2021-04-06 DIAGNOSIS — R059 Cough, unspecified: Secondary | ICD-10-CM | POA: Diagnosis not present

## 2021-04-06 DIAGNOSIS — Z20822 Contact with and (suspected) exposure to covid-19: Secondary | ICD-10-CM | POA: Diagnosis not present

## 2021-04-08 DIAGNOSIS — Z299 Encounter for prophylactic measures, unspecified: Secondary | ICD-10-CM | POA: Diagnosis not present

## 2021-04-08 DIAGNOSIS — I10 Essential (primary) hypertension: Secondary | ICD-10-CM | POA: Diagnosis not present

## 2021-04-08 DIAGNOSIS — Z789 Other specified health status: Secondary | ICD-10-CM | POA: Diagnosis not present

## 2021-04-08 DIAGNOSIS — R1310 Dysphagia, unspecified: Secondary | ICD-10-CM | POA: Diagnosis not present

## 2021-04-14 ENCOUNTER — Encounter (INDEPENDENT_AMBULATORY_CARE_PROVIDER_SITE_OTHER): Payer: Self-pay | Admitting: Gastroenterology

## 2021-04-14 ENCOUNTER — Ambulatory Visit (INDEPENDENT_AMBULATORY_CARE_PROVIDER_SITE_OTHER): Payer: Medicare PPO | Admitting: Gastroenterology

## 2021-04-14 ENCOUNTER — Other Ambulatory Visit: Payer: Self-pay

## 2021-04-14 VITALS — BP 136/69 | HR 70 | Temp 98.5°F | Ht <= 58 in | Wt 152.2 lb

## 2021-04-14 DIAGNOSIS — R590 Localized enlarged lymph nodes: Secondary | ICD-10-CM

## 2021-04-14 DIAGNOSIS — R0982 Postnasal drip: Secondary | ICD-10-CM | POA: Diagnosis not present

## 2021-04-14 DIAGNOSIS — R1310 Dysphagia, unspecified: Secondary | ICD-10-CM | POA: Diagnosis not present

## 2021-04-14 DIAGNOSIS — R1314 Dysphagia, pharyngoesophageal phase: Secondary | ICD-10-CM | POA: Diagnosis not present

## 2021-04-14 NOTE — Patient Instructions (Addendum)
Please give me a call tomorrow after you see the dentist ?I suspect your symptoms could be related to both the dental issue and some post nasal drip ?Please gargle warm salt water 3-4x/day ?You can try taking an over the counter antihistamine such as loratadine or cetirizine ?At this time, I do not feel your symptoms are indicative of an esophageal issue, however, if we rule out other causes then we will further discuss proceeding with an EGD for further evaluation. ? ? ? ?

## 2021-04-14 NOTE — Progress Notes (Signed)
Referring Provider: Kirstie Peri, MD Primary Care Physician:  Kirstie Peri, MD Primary GI Physician: Levon Hedger  Chief Complaint  Patient presents with   Dysphagia    Having trouble swallowing on left side. Also has part of a wisdom tooth coming up on left side, seeing dentist tomorrow. Has swollen lymph node on left side.   HPI:   Belinda [name] is a 88 y.o. female with past medical history of CAD, HTN, GERD, glaucoma, HLD, overactive bladder.   Patient presenting today for dysphagia.  Last seen July 2022 for diarrhea and choking episodes with swallowing solid foods, sometimes with coughing, was scheduled for modified barium swallow and advised to take imodium as needed for diarrhea if >4 episodes per day. MBS as below with SLP recommendations of dysphagia 3 diet and thin liquids with chewing precautions, likely age related changes contributing to dysphagia.a  Patient arrives today for c/o dysphagia. States that this has been an ongoing issue but symptoms are worse over the past few weeks. She reports that it feels as though foods stick only on the left side of her throat. Daughter reports she is coughing up phlegm and has been more hoarse. States that she typically does not have issues with liquids going down but foods feel like they get hung up on the left side of her throat. She reports some pain on the left side as well. She notes that she has some dental issues going on and has some teeth trying to come in on the left side as well, she is going to the dentist tomorrow for further evaluation of this. She was previously seen by her PCP and was told that she had an enlarged lymph node on the left side with some irritation of her throat, she also received an injection, daughter thinks it was prednisone and was started on flonase which has helped some. Notably patient appears to be having pain in her neck/throat during visit, upon swallowing. She has no nausea, vomiting, abdominal pain, early  satiety, rectal bleeding, melena, weight loss or other alarm symptoms.  Was seen in ED at [facility] on 04/06/21 for hoarseness, coughing up phlegm and sore throat. Strep testing negative. She had CT neck soft tissue with contrast that was unremarkable.   MBS: September 05, 2020: Two minor episodes of laryngeal penetration with thin liquid. Transient stasis of barium pill in the midthoracic esophagus caused by secondary and tertiary contractions which cleared with additional swallows.*SLP recommended dysphagia 3 diet with thin liquids, whole meds, small bites with sips between bites, remain upright after meals Last Colonoscopy:2013 - Scattered diverticula throughout the colon but predominantly at sigmoid colon. No polyps or other mucosal abnormalities noted. Normal rectal mucosa. Normal anal rectal junction. Last Endoscopy:?  Past Medical History:  Diagnosis Date   Borderline diabetes    Coronary atherosclerosis of native coronary artery    DES LAD and circumflex 1/09, LVEF 70%   Essential hypertension    GERD (gastroesophageal reflux disease)    Glaucoma    Mixed hyperlipidemia    Overactive bladder     Past Surgical History:  Procedure Laterality Date   APPENDECTOMY     BACK SURGERY     CHOLECYSTECTOMY     COLONOSCOPY  07/15/2011   Procedure: COLONOSCOPY;  Surgeon: Malissa Hippo, MD;  Location: [facility];  Service: Endoscopy;  Laterality: N/A;  930   HERNIA REPAIR     TONSILLECTOMY      Current Outpatient Medications  Medication Sig  Dispense Refill   acetaminophen (TYLENOL) 500 MG tablet Take 500 mg by mouth every 6 (six) hours as needed.     albuterol (PROVENTIL) (2.5 MG/3ML) 0.083% nebulizer solution Take 3 mLs (2.5 mg total) by nebulization every 4 (four) hours as needed for wheezing or shortness of breath. 75 mL 12   amLODipine (NORVASC) 2.5 MG tablet TAKE 1 TABLET BY MOUTH DAILY 90 tablet 1   bisoprolol (ZEBETA) 5 MG tablet TAKE 1 TABLET BY MOUTH DAILY 30 tablet 3    brimonidine (ALPHAGAN) 0.2 % ophthalmic solution instill ONE drop into BOTH eyes THREE TIMES DAILY     budesonide ([name]) 0.25 MG/2ML nebulizer solution INHALE THE CONTENTS OF ONE VIAL TWICE DAILY 120 mL 11   dicyclomine (BENTYL) 10 MG capsule Take 1 capsule (10 mg total) by mouth every 12 (twelve) hours as needed (abdominal pain). 60 capsule 2   dorzolamide (TRUSOPT) 2 % ophthalmic solution Place 1 drop into both eyes 3 (three) times daily.     fluticasone (FLONASE) 50 MCG/ACT nasal spray Place 1 spray into both nostrils daily.     isosorbide mononitrate (IMDUR) 30 MG 24 hr tablet Take 1 tablet (30 mg total) by mouth daily. 30 tablet 1   latanoprost (XALATAN) 0.005 % ophthalmic solution Place 1 drop into both eyes at bedtime.     levothyroxine (SYNTHROID) 25 MCG tablet Take 1 tablet by mouth daily.     meclizine (ANTIVERT) 25 MG tablet Take 1 tablet by mouth Twice daily as needed.     Melatonin 3 MG TABS Take 1 tablet by mouth at bedtime as needed.     Multiple Vitamins-Minerals (ICAPS AREDS 2) CAPS Take 1 tablet by mouth 2 (two) times daily.     OXYGEN 2lpm with sleep and as needed during the day DME- [facility]     pantoprazole (PROTONIX) 40 MG tablet TAKE 1 TABLET BY MOUTH TWICE DAILY 30-60 MINUTES BEFORE MEALS 60 tablet 11   pravastatin (PRAVACHOL) 20 MG tablet 1/2 tab by mouth once daily     PROAIR HFA 108 (90 Base) MCG/ACT inhaler inhale TWO puffs EVERY 4 HOURS AS NEEDED FOR wheezing OR SHORTNESS OF BREATH 8.5 g 2   Vitamin D, Ergocalciferol, (DRISDOL) 50000 UNITS CAPS capsule Take 50,000 Units by mouth every Wednesday.      No current facility-administered medications for this visit.   Allergies as of 04/14/2021   (No Known Allergies)   No family history on file.  Social History   Socioeconomic History   Marital status: Widowed    Spouse name: Not on file   Number of children: Not on file   Years of education: Not on file   Highest education level: Not on file   Occupational History   Not on file  Tobacco Use   Smoking status: Former    Packs/day: 0.10    Years: 2.00    Pack years: 0.20    Types: Cigarettes    Quit date: 02/08/1946    Years since quitting: 75.2   Smokeless tobacco: Never  Vaping Use   Vaping Use: Never used  Substance and Sexual Activity   Alcohol use: No    Alcohol/week: 0.0 standard drinks   Drug use: No   Sexual activity: Not on file  Other Topics Concern   Not on file  Social History Narrative   Not on file   Social Determinants of Health   Financial Resource Strain: Not on file  Food Insecurity: Not on file  Transportation  Needs: Not on file  Physical Activity: Not on file  Stress: Not on file  Social Connections: Not on file   Review of systems General: negative for malaise, night sweats, fever, chills, weight loss Neck: Negative for lumps, goiter, pain and significant neck swelling Resp: Negative for cough, wheezing, dyspnea at rest CV: Negative for chest pain, leg swelling, palpitations, orthopnea GI: denies melena, hematochezia, nausea, vomiting, diarrhea, constipation,  early satiety or unintentional weight loss. +dysphagia (only on L side of throat) +odynophagia MSK: Negative for joint pain or swelling, back pain, and muscle pain. Derm: Negative for itching or rash Psych: Denies depression, anxiety, memory loss, confusion. No homicidal or suicidal ideation.  Heme: Negative for prolonged bleeding, bruising easily, and swollen nodes. Endocrine: Negative for cold or heat intolerance, polyuria, polydipsia and goiter. Neuro: negative for tremor, gait imbalance, syncope and seizures. The remainder of the review of systems is noncontributory.  Physical Exam: BP 136/69 (BP Location: Left Arm, Patient Position: Sitting, Cuff Size: Large)    Pulse 70    Temp 98.5 F (36.9 C) (Oral)    Ht 4\' 10"  (1.473 m)    Wt 152 lb 3.2 oz (69 kg)    BMI 31.81 kg/m  General:   Alert and oriented. No distress noted.  Pleasant and cooperative.  Head:  Normocephalic and atraumatic. Eyes:  Conjuctiva clear without scleral icterus. Mouth:  Oral mucosa pink and moist. Good dentition. No lesions. Wisdom tooth? Protruding through gum in posterior L gum Neck: enlarged submandibular lymph node on left side, tender to palpation Heart: Normal rate and rhythm, s1 and s2 heart sounds present.  Lungs: Clear lung sounds in all lobes. Respirations equal and unlabored. Abdomen:  +BS, soft, non-tender and non-distended. No rebound or guarding. No HSM or masses noted. Derm: No palmar erythema or jaundice Msk:  Symmetrical without gross deformities. Normal posture. Extremities:  Without edema. Neurologic:  Alert and  oriented x4 Psych:  Alert and cooperative. Normal mood and affect.  Invalid input(s): 6 MONTHS   ASSESSMENT: Belinda [name] is a 88 y.o. female presenting today for dysphagia/odynophagia and L submandibular lymphadenopathy.  Pt reports for the past few weeks she has been coughing up phlegm, felt that her throat was irritated and Notably she tells me that she feels difficulty swallowing only on the left side of her throat, at times like it is swollen inside. She appears to be experiencing pain with swallowing during her visit. She has a tender, swollen, Left submandibular lymph node as well as evidence of a tooth, likely a wisdom tooth, protruding through her posterior L gum. Reassuringly, [facility]. She has appt with dentist tomorrow regarding tooth that is protruding through her gum. Notably, MBS done in July 2022 with likely age related changes. She has no other alarm features. She will keep me updated on findings after her dentist visit. If no improvement with possible dental interventions and treatment of upper respiratory symptoms, will discuss further evaluation with EGD. I have a low suspicion that current dysphagia/odynophagia symptoms are GI related. More likely  some contribution of both post nasal drip/upper respiratory symptoms coupled with the tooth she has coming that in are causing secondary lymphadenopathy and localized edema.  All questions were answered, patient and daughter verbalized understanding and are in agreement with plan as outlined above.    PLAN:  Warm salt water gargles 3-4x/day 2. Daily Otc antihistamine such as cetirizine/loratadine 3. Keep appt with dentist,  pt to call with update 4. Can taken 400-600mg  ibuprofen as needed every 6-8 hours for pain, discomfort, to help with swelling. Do not take ibuprofen more than 2-3 days in a row. 5. Can continue flonase as previously prescribed  Follow Up: TBD  Rosaleigh Brazzel L. Jeanmarie Hubert, MSN, APRN, AGNP-C Adult-Gerontology Nurse Practitioner [facility]

## 2021-04-15 DIAGNOSIS — R058 Other specified cough: Secondary | ICD-10-CM | POA: Insufficient documentation

## 2021-04-15 DIAGNOSIS — R0982 Postnasal drip: Secondary | ICD-10-CM | POA: Insufficient documentation

## 2021-04-16 DIAGNOSIS — R590 Localized enlarged lymph nodes: Secondary | ICD-10-CM | POA: Insufficient documentation

## 2021-04-21 DIAGNOSIS — Z299 Encounter for prophylactic measures, unspecified: Secondary | ICD-10-CM | POA: Diagnosis not present

## 2021-04-21 DIAGNOSIS — R6884 Jaw pain: Secondary | ICD-10-CM | POA: Diagnosis not present

## 2021-04-21 DIAGNOSIS — F419 Anxiety disorder, unspecified: Secondary | ICD-10-CM | POA: Diagnosis not present

## 2021-04-21 DIAGNOSIS — I10 Essential (primary) hypertension: Secondary | ICD-10-CM | POA: Diagnosis not present

## 2021-04-21 DIAGNOSIS — Z789 Other specified health status: Secondary | ICD-10-CM | POA: Diagnosis not present

## 2021-04-23 DIAGNOSIS — F339 Major depressive disorder, recurrent, unspecified: Secondary | ICD-10-CM | POA: Diagnosis not present

## 2021-04-23 DIAGNOSIS — I700 Atherosclerosis of aorta: Secondary | ICD-10-CM | POA: Diagnosis not present

## 2021-04-23 DIAGNOSIS — J9611 Chronic respiratory failure with hypoxia: Secondary | ICD-10-CM | POA: Diagnosis not present

## 2021-04-23 DIAGNOSIS — I10 Essential (primary) hypertension: Secondary | ICD-10-CM | POA: Diagnosis not present

## 2021-04-23 DIAGNOSIS — Z299 Encounter for prophylactic measures, unspecified: Secondary | ICD-10-CM | POA: Diagnosis not present

## 2021-05-05 DIAGNOSIS — G629 Polyneuropathy, unspecified: Secondary | ICD-10-CM | POA: Diagnosis not present

## 2021-05-05 DIAGNOSIS — Z299 Encounter for prophylactic measures, unspecified: Secondary | ICD-10-CM | POA: Diagnosis not present

## 2021-05-05 DIAGNOSIS — Z789 Other specified health status: Secondary | ICD-10-CM | POA: Diagnosis not present

## 2021-05-18 ENCOUNTER — Other Ambulatory Visit: Payer: Self-pay | Admitting: Cardiology

## 2021-05-27 ENCOUNTER — Encounter: Payer: Self-pay | Admitting: Internal Medicine

## 2021-05-27 ENCOUNTER — Ambulatory Visit: Payer: Medicare PPO | Admitting: Internal Medicine

## 2021-05-27 VITALS — BP 132/78 | HR 76 | Temp 98.7°F | Ht <= 58 in | Wt 148.2 lb

## 2021-05-27 DIAGNOSIS — R0609 Other forms of dyspnea: Secondary | ICD-10-CM | POA: Diagnosis not present

## 2021-05-27 DIAGNOSIS — J9611 Chronic respiratory failure with hypoxia: Secondary | ICD-10-CM

## 2021-05-27 DIAGNOSIS — J441 Chronic obstructive pulmonary disease with (acute) exacerbation: Secondary | ICD-10-CM | POA: Diagnosis not present

## 2021-05-27 DIAGNOSIS — R058 Other specified cough: Secondary | ICD-10-CM

## 2021-05-27 DIAGNOSIS — J399 Disease of upper respiratory tract, unspecified: Secondary | ICD-10-CM

## 2021-05-27 DIAGNOSIS — J45901 Unspecified asthma with (acute) exacerbation: Secondary | ICD-10-CM

## 2021-05-27 MED ORDER — GABAPENTIN 100 MG PO CAPS: 100.0000 mg | ORAL_CAPSULE | Freq: Four times a day (QID) | ORAL | 2 refills | Status: DC

## 2021-05-27 MED ORDER — PREDNISONE 10 MG PO TABS: ORAL_TABLET | ORAL | 0 refills | Status: DC

## 2021-05-27 MED ORDER — ALBUTEROL SULFATE (2.5 MG/3ML) 0.083% IN NEBU: 2.5000 mg | INHALATION_SOLUTION | Freq: Four times a day (QID) | RESPIRATORY_TRACT | 12 refills | Status: DC

## 2021-05-27 NOTE — Patient Instructions (Addendum)
Plan A = Automatic = Always=    albuterol 2.5 mg with budesonide 0.25 mg 1st thing in am and 12 hours later ? ?Plan B = Backup (to supplement plan A, not to replace it) ?Only use your albuterol nebulizer as a rescue medication to be used if you can't catch your breath by resting or doing a relaxed purse lip breathing pattern.  ?- The less you use it, the better it will work when you need it. ?- Ok to use the inhaler up to  every 4 hours if you must but call for appointment if use goes up over your usual need ?- Don't leave home without it !!  (think of it like the spare tire for your car)  ? ?Ok to try albuterol 15 min before an activity (on alternating days)  that you know would usually make you short of breath and see if it makes any difference and if makes none then don't take albuterol after activity unless you can't catch your breath as this means it's the resting that helps, not the albuterol. ? ?Make sure you check your oxygen saturation  at your highest level of activity  to be sure it stays over 90% and adjust  02 flow upward to maintain this level if needed but remember to turn it back to previous settings when you stop (to conserve your supply).  ? ?Increase gabapentin to 100 mg four times daily  - reduce dose if get woozy ? ?Prednisone 10 mg take  4 each am x 2 days,   2 each am x 2 days,  1 each am x 2 days and stop  ? ? ?Be sure pantoprazole is Take 30- 60 min before your first and last meals of the day  ? ?GERD (REFLUX)  is an extremely common cause of respiratory symptoms just like yours , many times with no obvious heartburn at all.  ? ? It can be treated with medication, but also with lifestyle changes including elevation of the head of your bed (ideally with 6 -8inch blocks under the headboard of your bed),  Smoking cessation, avoidance of late meals, excessive alcohol, and avoid fatty foods, chocolate, peppermint, colas, red wine, and acidic juices such as orange juice.  ?NO MINT OR MENTHOL  PRODUCTS SO NO COUGH DROPS  ?USE SUGARLESS CANDY INSTEAD (Jolley ranchers or Stover's or Life Savers) or even ice chips will also do - the key is to swallow to prevent all throat clearing. ?NO OIL BASED VITAMINS - use powdered substitutes.  Avoid fish oil when coughing.  ? ?We will humidify  your 02  ? ?Please schedule a follow up office visit in 6 weeks, call sooner if needed with all medications /inhalers/ solutions in hand so we can verify exactly what you are taking. This includes all medications from all doctors and over the counters   ?    ? ?  ?

## 2021-05-27 NOTE — Progress Notes (Signed)
?Subjective:  ?  ? Patient ID: Belinda [name], female   DOB: 1925/08/08,   MRN: [id number] ? ?Brief patient profile:  ?88  yowf  Quit smoking age 88 with seasonal symptoms of coughing sneezing worse since onset breathing problem around 2009  > cardiac eval :  DES to LAD and Cx 2009 with preserved LV fxn as of 2014.  ? ? ?History of Present Illness  ?07/27/2017 1st [facility] office visit/ Devona Holmes   ?Chief Complaint  ?Patient presents with  ? Pulmonary Consult  ?  Referred by Dr. Sherril Croon for eval of COPD.  Pt c/o SOB for the past 5-6 yrs, worse for the past 2 years. She states that she gets SOB "when I move alot".  She has had cough for at least the past month- prod with yellow to clear sputum. She states she often gets "choked up"  ?wearing 02 hs x sev years and sleeps ok on one pillow but p stirring  Every day x years now starts with coughing/choking worse with eating also and sob with minimal activity. ?Did not improve on symbicort x  Several year Dr Juanetta Gosling changed to trelegy x 6 monts prior to OV   ?Juanetta Gosling and Orson Aloe do not feel she has copd per Daughter  ?rec ?I strongly recommend your eye doctor consider an alternative to your timolol eyedrops as they can cause cough and wheeze ?Stop atenolol and replace bisoprolol 5 mg one daily (bag #1)  ?Stop trelegy and fish oil  ?Change pantoprazole to 40 mg Take 30- 60 min before your first and last meals of the day  ?GERD   ?Plan A = Automatic = Brovana 15 mcg twice daily along budesonide 0.25 mg (Bag #1)  ?Plan B = Backup (Bag #2)  ?Only use your albuterol as a rescue medication  ?For cough > take your cough syrup (bag # 2) ? ?08/23/17 NP Med calendar provided and rec use it and return to ov with it in hand ?  ? ?08/09/2018  f/u ov/Nyhla [facility] re: asthma/ chronic resp failure, did not bring meds or 02  ?Chief Complaint  ?Patient presents with  ? Follow-up  ?  Breathing is about the same. She has been wheezing more. She is not using her albuterol inhaler.   ?Dyspnea:  Mailbox  and back s stopping is her goal  ?Cough: not much, gets choked some with food/ already on  on ppi but not bid ac consistently  ?Sleeping: 30 degrees does ok  ?SABA use: none  ?02: 2lpm hs and not using at all daytime - "left it in car"  ?rec ?Protonix 40 mg Take 30- 60 min before your first and last meals of the day  ?Continue 02 2lpm at bedtime and only use daytime to keep sats over 90% if needed ? ? ?11/06/2018 NP  ?rec ?Continue on current regimen .  ?Continue on Oxygen 2l/m At bedtime  .  ?Continue on Budesonide and Brovana Neb Twice daily   ? ?[place] > back to baseline ? ?  ?11/02/2019  f/u ov/ office/Taryne Kiger re: asthma just on budesonide bid ?Chief Complaint  ?Patient presents with  ? Follow-up  ?  shortness of breath with exertion  ?Dyspnea:  Less active due to balance with breathing not limiting  ?Cough: not much  At all  ?Sleeping: bed is flat / one pillow  ?SABA use: proair maybe once a week/ never neb saba ?02: 2 hs / not much  during the day  ?Rec ?Make sure you check your oxygen saturations at highest level of activity  ? ? ? ? ?11/19/2020  f/u ov/[place] office/Carisha Kantor re: asthma/ resp failure maint on 02 hs and prn  daytime plus bud bid but no formoterol   ?Chief Complaint  ?Patient presents with  ? Follow-up  ?  SOB has worsened since last OV.Using oxygen at home and during the day as needed. 2L O2 cont.   ? Dyspnea:  house bound, no shopping  ?Cough: none  ?Sleeping: flat bed / one pillow  ?SABA use: neb helps some  but not really using much  ?02: 2 lpm hs and not using daytime  ?Covid status: vax x 2  ?Rec ?Plan A = Automatic = Always=    albuterol 2.5 mg with budesonide 0.25 mg 1st thing in am and 12 hours later ?Plan B = Backup (to supplement plan A, not to replace it) ?Only use your albuterol nebulizer ?Ok to try albuterol 15 min before an activity ?Make sure you check your oxygen saturation  at your highest level of activity  to be sure it stays over 90%  ?  ?   ? ? ?05/27/2021  f/u ov/ office/Deondray Ospina re: asthma/ 02 dep maint on ?? Just budesonide  ?Chief Complaint  ?Patient presents with  ? Follow-up  ?  Feels breathing has not improved much since last ov.  ?  ?Dyspnea:  nice days walks to mail box 50 ft uphill  with rolling walker not checking sats on RA ?Cough: lots of mucus wakes her up 3 am most mornings then continuous urge to clear throat during the day   ?Sleeping: flat bed / slt elevation hob ?SABA use: none  ?02: 2lpm prn  ?Covid status:vax x 2  ?  ?No obvious day to day or daytime variability or assoc excess/ purulent sputum or mucus plugs or hemoptysis or cp or chest tightness, subjective wheeze or overt sinus or hb symptoms.  ? ?Sleeping  without nocturnal  or early am exacerbation  of respiratory  c/o's or need for noct saba. Also denies any obvious fluctuation of symptoms with weather or environmental changes or other aggravating or alleviating factors except as outlined above  ? ?No unusual exposure hx or h/o childhood pna/ asthma or knowledge of premature birth. ? ?Current Allergies, Complete Past Medical History, Past Surgical History, Family History, and Social History were reviewed in [facility] electronic medical record. ? ?ROS  The following are not active complaints unless bolded ?Hoarseness, sore throat, dysphagia/globus, dental problems, itching, sneezing,  nasal congestion or discharge of excess mucus or purulent secretions, ear ache,   fever, chills, sweats, unintended wt loss or wt gain, classically pleuritic or exertional cp,  orthopnea pnd or arm/hand swelling  or leg swelling, presyncope, palpitations, abdominal pain, anorexia, nausea, vomiting, diarrhea  or change in bowel habits or change in bladder habits, change in stools or change in urine, dysuria, hematuria,  rash, arthralgias, visual complaints, headache, numbness, weakness or ataxia or problems with walking or coordination,  change in mood or  memory. ?      ? ?Current  Meds  ?Medication Sig  ? acetaminophen (TYLENOL) 500 MG tablet Take 500 mg by mouth every 6 (six) hours as needed.  ? albuterol (PROVENTIL) (2.5 MG/3ML) 0.083% nebulizer solution Take 3 mLs (2.5 mg total) by nebulization every 4 (four) hours as needed for wheezing or shortness of breath.  ? amLODipine (NORVASC) 2.5 MG tablet TAKE 1 TABLET  BY MOUTH DAILY  ? bisoprolol (ZEBETA) 5 MG tablet TAKE 1 TABLET BY MOUTH DAILY  ? brimonidine (ALPHAGAN) 0.2 % ophthalmic solution instill ONE drop into BOTH eyes THREE TIMES DAILY  ? budesonide ([name]) 0.25 MG/2ML nebulizer solution INHALE THE CONTENTS OF ONE VIAL TWICE DAILY  ? Cyanocobalamin (VITAMIN B 12 PO) Take by mouth.  ? dicyclomine (BENTYL) 10 MG capsule Take 1 capsule (10 mg total) by mouth every 12 (twelve) hours as needed (abdominal pain).  ? dorzolamide (TRUSOPT) 2 % ophthalmic solution Place 1 drop into both eyes 3 (three) times daily.  ? fluticasone (FLONASE) 50 MCG/ACT nasal spray Place 1 spray into both nostrils daily.  ? gabapentin (NEURONTIN) 100 MG capsule Take by mouth.  ? isosorbide mononitrate (IMDUR) 30 MG 24 hr tablet Take 1 tablet (30 mg total) by mouth daily.  ? latanoprost (XALATAN) 0.005 % ophthalmic solution Place 1 drop into both eyes at bedtime.  ? levothyroxine (SYNTHROID) 25 MCG tablet Take 1 tablet by mouth daily.  ? meclizine (ANTIVERT) 25 MG tablet Take 1 tablet by mouth Twice daily as needed.  ? Melatonin 3 MG TABS Take 1 tablet by mouth at bedtime as needed.  ? Multiple Vitamins-Minerals (ICAPS AREDS 2) CAPS Take 1 tablet by mouth 2 (two) times daily.  ? OXYGEN 2lpm with sleep and as needed during the day ?DME- [facility]  ? pantoprazole (PROTONIX) 40 MG tablet TAKE 1 TABLET BY MOUTH TWICE DAILY 30-60 MINUTES BEFORE MEALS  ? pravastatin (PRAVACHOL) 20 MG tablet 1/2 tab by mouth once daily  ? PROAIR HFA 108 (90 Base) MCG/ACT inhaler inhale TWO puffs EVERY 4 HOURS AS NEEDED FOR wheezing OR SHORTNESS OF BREATH  ? Vitamin D, Ergocalciferol,  (DRISDOL) 50000 UNITS CAPS capsule Take 50,000 Units by mouth every Wednesday.   ?     ?   ? ?  ? ?  ? ?   ?Objective:  ? Physical Exam ?Wt ? ?05/27/2021        148 ?11/19/2020         158  ?11/02/2019

## 2021-05-28 ENCOUNTER — Encounter: Payer: Self-pay | Admitting: Internal Medicine

## 2021-05-28 NOTE — Telephone Encounter (Signed)
"  Good morning Dr. Melvyn Novas, ?  ?Why did you increase mom's Gabapentin to 4 per day?  I need a clear understanding.   The Prednisone is to help the mucus in her throat, correct? ?  ?I hope you have a great day! ?  ?Thank you," ? ? ?Dr. Melvyn Novas, I looked through your notes from [facility] patient and saw where patient was put on prednisone and gabapentin but did not see why. Can you please advise?  ?Thanks!  ?

## 2021-05-28 NOTE — Assessment & Plan Note (Signed)
Trial of gabapentin to 100 mg qid and max gerd rx 05/28/2021 >>>  ? ?Upper airway cough syndrome (previously labeled PNDS),  is so named because it's frequently impossible to sort out how much is  CR/sinusitis with freq throat clearing (which can be related to primary GERD)   vs  causing  secondary (" extra esophageal")  GERD from wide swings in gastric pressure that occur with throat clearing, often  promoting self use of mint and menthol lozenges that reduce the lower esophageal sphincter tone and exacerbate the problem further in a cyclical fashion.  ? ?These are the same pts (now being labeled as having "irritable larynx syndrome" by some cough centers) who not infrequently have a history of having failed to tolerate ace inhibitors,  dry powder inhalers or biphosphonates or report having atypical/extraesophageal reflux symptoms that don't respond to standard doses of PPI  and are easily confused as having aecopd or asthma flares by even experienced allergists/ pulmonologists (myself included).  ? ?>>> see rx as above, regroup in 6 weeks  ? ?Each maintenance medication was reviewed in detail including emphasizing most importantly the difference between maintenance and prns and under what circumstances the prns are to be triggered using an action plan format where appropriate. ? ?Total time for H and P, chart review, counseling with son Loraine Leriche present, reviewing neb/02 device(s) , directly observing portions of ambulatory 02 saturation study/ and generating customized AVS unique to this office visit / same day charting = 32 min  ?     ?  ?      ?

## 2021-05-28 NOTE — Assessment & Plan Note (Signed)
06/28/2018 Patient saturation on room air at rest = 94% >>>  while ambulating = 84% x 125 ft slow pace >  Placed on 2 liters of O2 while ambulating = 97% ?- 08/09/2018  Did not desaturate walking 250 ft though was sob  ?- 04/09/2019   Walked RA x one lap =  approx 250 ft -@ slow pace stopped due to 86%   ?- 05/27/2021 no desats walking 100 ft slow and flat but stopped p 100 ft due to sob ? ?As of 05/27/2021 rec 2lpm hs and prn daytime if sats dip < 90% with activity ?

## 2021-05-28 NOTE — Assessment & Plan Note (Addendum)
Onset around 2014  ?- 08/09/2018   Walked RA x one lap =  approx 250 ft - stopped due to  Sob at slow pace with walker but no desats  ?- 05/27/2021   Walked on RA  x    approx 100  ft  @ slow/walker pace, stopped due to sob with lowest 02 sats 94%  ? ?rec try adding back albuterol  2.5 mg bid with budesonide and also informed: ?Ok to try albuterol 15 min before an activity (on alternating days)  that you know would usually make you short of breath and see if it makes any difference and if makes none then don't take albuterol after activity unless you can't catch your breath as this means it's the resting that helps, not the albuterol. ?    ? ? ? ? ?

## 2021-05-28 NOTE — Telephone Encounter (Signed)
Increase to twice daily for  week and a pill per day every week until cough is gone or taking 4 xdaily  ? ?Prednisone may given short term relief for cough/ breathing but gabapentin should help better long term if we find the lowest effective dose s noting any wooziness or balance issue which are the main limiting factors  ?

## 2021-06-04 DIAGNOSIS — H401112 Primary open-angle glaucoma, right eye, moderate stage: Secondary | ICD-10-CM | POA: Diagnosis not present

## 2021-06-17 ENCOUNTER — Other Ambulatory Visit: Payer: Self-pay | Admitting: Cardiology

## 2021-06-19 DIAGNOSIS — I10 Essential (primary) hypertension: Secondary | ICD-10-CM | POA: Diagnosis not present

## 2021-06-22 DIAGNOSIS — E559 Vitamin D deficiency, unspecified: Secondary | ICD-10-CM | POA: Diagnosis not present

## 2021-06-22 DIAGNOSIS — M810 Age-related osteoporosis without current pathological fracture: Secondary | ICD-10-CM | POA: Diagnosis not present

## 2021-06-25 DIAGNOSIS — R1310 Dysphagia, unspecified: Secondary | ICD-10-CM | POA: Diagnosis not present

## 2021-06-25 DIAGNOSIS — R0989 Other specified symptoms and signs involving the circulatory and respiratory systems: Secondary | ICD-10-CM | POA: Diagnosis not present

## 2021-06-25 DIAGNOSIS — J45909 Unspecified asthma, uncomplicated: Secondary | ICD-10-CM | POA: Diagnosis not present

## 2021-06-25 DIAGNOSIS — J3489 Other specified disorders of nose and nasal sinuses: Secondary | ICD-10-CM | POA: Diagnosis not present

## 2021-06-25 DIAGNOSIS — Z87891 Personal history of nicotine dependence: Secondary | ICD-10-CM | POA: Diagnosis not present

## 2021-07-09 ENCOUNTER — Encounter: Payer: Self-pay | Admitting: Internal Medicine

## 2021-07-09 ENCOUNTER — Ambulatory Visit: Payer: Medicare PPO | Admitting: Internal Medicine

## 2021-07-09 DIAGNOSIS — R058 Other specified cough: Secondary | ICD-10-CM

## 2021-07-09 DIAGNOSIS — J9611 Chronic respiratory failure with hypoxia: Secondary | ICD-10-CM | POA: Diagnosis not present

## 2021-07-09 DIAGNOSIS — J441 Chronic obstructive pulmonary disease with (acute) exacerbation: Secondary | ICD-10-CM | POA: Diagnosis not present

## 2021-07-09 DIAGNOSIS — J45901 Unspecified asthma with (acute) exacerbation: Secondary | ICD-10-CM

## 2021-07-09 NOTE — Progress Notes (Unsigned)
Subjective:     Patient ID: Belinda [name], female   DOB: 1925/06/09,   MRN: [id number]  Brief patient profile:  53  yowf  Quit smoking age 88 with seasonal symptoms of coughing sneezing worse since onset breathing problem around 2009  > cardiac eval :  DES to LAD and Cx 2009 with preserved LV fxn as of 2014.    History of Present Illness  07/27/2017 1st [facility] office visit/ Belinda [name]   Chief Complaint  Patient presents with   Pulmonary Consult    Referred by Dr. Sherril Croon for eval of COPD.  Pt c/o SOB for the past 5-6 yrs, worse for the past 2 years. She states that she gets SOB "when I move alot".  She has had cough for at least the past month- prod with yellow to clear sputum. She states she often gets "choked up"  wearing 02 hs x sev years and sleeps ok on one pillow but p stirring  Every day x years now starts with coughing/choking worse with eating also and sob with minimal activity. Did not improve on symbicort x  Several year Dr Juanetta Gosling changed to trelegy x 6 monts prior to OV   [facility] and Orson Aloe do not feel she has copd per Daughter  rec I strongly recommend your eye doctor consider an alternative to your timolol eyedrops as they can cause cough and wheeze Stop atenolol and replace bisoprolol 5 mg one daily (bag #1)  Stop trelegy and fish oil  Change pantoprazole to 40 mg Take 30- 60 min before your first and last meals of the day  GERD   Plan A = Automatic = Brovana 15 mcg twice daily along budesonide 0.25 mg (Bag #1)  Plan B = Backup (Bag #2)  Only use your albuterol as a rescue medication  For cough > take your cough syrup (bag # 2)  08/23/17 NP Med calendar provided and rec use it and return to ov with it in hand    08/09/2018  f/u ov/Belinda [name] re: asthma/ chronic resp failure, did not bring meds or 02  Chief Complaint  Patient presents with   Follow-up    Breathing is about the same. She has been wheezing more. She is not using her albuterol inhaler.   Dyspnea:  Mailbox  and back s stopping is her goal  Cough: not much, gets choked some with food/ already on  on ppi but not bid ac consistently  Sleeping: 30 degrees does ok  SABA use: none  02: 2lpm hs and not using at all daytime - "left it in car"  rec Protonix 40 mg Take 30- 60 min before your first and last meals of the day  Continue 02 2lpm at bedtime and only use daytime to keep sats over 90% if needed   11/06/2018 NP  rec Continue on current regimen .  Continue on Oxygen 2l/m At bedtime  .  Continue on Budesonide and Brovana Neb Twice daily    [place] > back to baseline    11/02/2019  f/u ov/[place] office/Belinda [name] re: asthma just on budesonide bid Chief Complaint  Patient presents with   Follow-up    shortness of breath with exertion  Dyspnea:  Less active due to balance with breathing not limiting  Cough: not much  At all  Sleeping: bed is flat / one pillow  SABA use: proair maybe once a week/ never neb saba 02: 2 hs / not much  during the day  Rec Make sure you check your oxygen saturations at highest level of activity      11/19/2020  f/u ov/[place] office/Belinda [name] re: asthma/ resp failure maint on 02 hs and prn  daytime plus bud bid but no formoterol   Chief Complaint  Patient presents with   Follow-up    SOB has worsened since last OV.Using oxygen at home and during the day as needed. 2L O2 cont.   Dyspnea:  house bound, no shopping  Cough: none  Sleeping: flat bed / one pillow  SABA use: neb helps some  but not really using much  02: 2 lpm hs and not using daytime  Covid status: vax x 2  Rec Plan A = Automatic = Always=    albuterol 2.5 mg with budesonide 0.25 mg 1st thing in am and 12 hours later Plan B = Backup (to supplement plan A, not to replace it) Only use your albuterol nebulizer Ok to try albuterol 15 min before an activity Make sure you check your oxygen saturation  at your highest level of activity  to be sure it stays over 90%         05/27/2021  f/u ov/[place] office/Belinda [name] re: asthma/ 02 dep maint on ?? Just budesonide  Chief Complaint  Patient presents with   Follow-up    Feels breathing has not improved much since last ov.    Dyspnea:  nice days walks to mail box 50 ft uphill  with rolling walker not checking sats on RA Cough: lots of mucus wakes her up 3 am most mornings then continuous urge to clear throat during the day   Sleeping: flat bed / slt elevation hob SABA use: none  02: 2lpm prn  Covid status:vax x 2   Rec Plan A = Automatic = Always=    albuterol 2.5 mg with budesonide 0.25 mg 1st thing in am and 12 hours later Plan B = Backup (to supplement plan A, not to replace it) Only use your albuterol nebulizer as a rescue medication  Ok to try albuterol 15 min before an activity (on alternating days)  that you know would usually make you short of breath  Make sure you check your oxygen saturation  at your highest level of activity  to be sure it stays over 90%  Increase gabapentin to 100 mg four times daily  - reduce dose if get woozy Prednisone 10 mg take  4 each am x 2 days,   2 each am x 2 days,  1 each am x 2 days and stop  Be sure pantoprazole is Take 30- 60 min before your first and last meals of the day  GERD diet reviewed, bed blocks rec  We will humidify  your 02  Please schedule a follow up office visit in 6 weeks, call sooner if needed with all medications /inhalers/ solutions in hand    07/09/2021  f/u ov/[place] office/Belinda [name] re: UACS  maint on  gabapentin 100 mg tid  Chief Complaint  Patient presents with   Follow-up    Need a refill on proair  Feels breathing is doing well.    Dyspnea:  ok a rest  Cough: vigorous throat clearing  Sleeping: sleeps on flat bed on side with one pillow SABA use: qid albuterol  02: 2lpm hs        No obvious day to day or daytime variability or assoc excess/ purulent sputum or mucus plugs or hemoptysis or cp or chest  tightness, subjective wheeze or  overt sinus or hb symptoms.   *** without nocturnal  or early am exacerbation  of respiratory  c/o's or need for noct saba. Also denies any obvious fluctuation of symptoms with weather or environmental changes or other aggravating or alleviating factors except as outlined above   No unusual exposure hx or h/o childhood pna/ asthma or knowledge of premature birth.  Current Allergies, Complete Past Medical History, Past Surgical History, Family History, and Social History were reviewed in [facility] electronic medical record.  ROS  The following are not active complaints unless bolded Hoarseness, sore throat, dysphagia, dental problems, itching, sneezing,  nasal congestion or discharge of excess mucus or purulent secretions, ear ache,   fever, chills, sweats, unintended wt loss or wt gain, classically pleuritic or exertional cp,  orthopnea pnd or arm/hand swelling  or leg swelling, presyncope, palpitations, abdominal pain, anorexia, nausea, vomiting, diarrhea  or change in bowel habits or change in bladder habits, change in stools or change in urine, dysuria, hematuria,  rash, arthralgias, visual complaints, headache, numbness, weakness or ataxia or problems with walking or coordination,  change in mood or  memory.        Current Meds  Medication Sig   acetaminophen (TYLENOL) 500 MG tablet Take 500 mg by mouth every 6 (six) hours as needed.   albuterol (PROVENTIL) (2.5 MG/3ML) 0.083% nebulizer solution Take 3 mLs (2.5 mg total) by nebulization every 4 (four) hours as needed for wheezing or shortness of breath.   amLODipine (NORVASC) 2.5 MG tablet TAKE 1 TABLET BY MOUTH DAILY   bisoprolol (ZEBETA) 5 MG tablet TAKE 1 TABLET BY MOUTH DAILY   brimonidine (ALPHAGAN) 0.2 % ophthalmic solution instill ONE drop into BOTH eyes THREE TIMES DAILY   budesonide ([name]) 0.25 MG/2ML nebulizer solution INHALE THE CONTENTS OF ONE VIAL TWICE DAILY   Cyanocobalamin (VITAMIN B 12 PO) Take by mouth.    dicyclomine (BENTYL) 10 MG capsule Take 1 capsule (10 mg total) by mouth every 12 (twelve) hours as needed (abdominal pain).   dorzolamide (TRUSOPT) 2 % ophthalmic solution Place 1 drop into both eyes 3 (three) times daily.   fluticasone (FLONASE) 50 MCG/ACT nasal spray Place 1 spray into both nostrils daily.   gabapentin (NEURONTIN) 100 MG capsule Take 1 capsule (100 mg total) by mouth 4 (four) times daily.   isosorbide mononitrate (IMDUR) 30 MG 24 hr tablet Take 1 tablet (30 mg total) by mouth daily.   latanoprost (XALATAN) 0.005 % ophthalmic solution Place 1 drop into both eyes at bedtime.   levothyroxine (SYNTHROID) 25 MCG tablet Take 1 tablet by mouth daily.   meclizine (ANTIVERT) 25 MG tablet Take 1 tablet by mouth Twice daily as needed.   Multiple Vitamins-Minerals (ICAPS AREDS 2) CAPS Take 1 tablet by mouth 2 (two) times daily.   OXYGEN 2lpm with sleep and as needed during the day DME- [facility]   pantoprazole (PROTONIX) 40 MG tablet TAKE 1 TABLET BY MOUTH TWICE DAILY 30-60 MINUTES BEFORE MEALS   pravastatin (PRAVACHOL) 20 MG tablet 1/2 tab by mouth once daily   PROAIR HFA 108 (90 Base) MCG/ACT inhaler inhale TWO puffs EVERY 4 HOURS AS NEEDED FOR wheezing OR SHORTNESS OF BREATH   Vitamin D, Ergocalciferol, (DRISDOL) 50000 UNITS CAPS capsule Take 50,000 Units by mouth every Wednesday.                    Objective:   Physical Exam Wt  07/09/2021  153  05/27/2021        148 11/19/2020         158  11/02/2019        152  04/09/2019          156  08/09/2018          159  06/28/2018        162  10/24/2017       159   07/27/17 153 lb (69.4 kg)  12/10/16 159 lb 12.8 oz (72.5 kg)  04/20/16 156 lb 12.8 oz (71.1 kg)     Vital signs reviewed  07/09/2021  - Note at rest 02 sats  ***% on ***   General appearance:    amb elderly wf / walks with walker/ vigorous violent sustained dry sounding throat clearing    Min barrel/kyphotic***      Assessment:

## 2021-07-09 NOTE — Patient Instructions (Addendum)
Only use your albuterol as a rescue medication to be used if you can't catch your breath by resting or doing a relaxed purse lip breathing pattern.  - The less you use it, the better it will work when you need it. - Ok to use up to 2 puffs  every 4 hours if you must but call for immediate appointment if use goes up over your usual need - Don't leave home without it !!  (think of it like the spare tire for your car)   Stop budesonide (pulmicort) as it may just be irritating your throat   Increase gabapentin one week starting with the bedtime dose - max dose is 1200 mg daily with the main limit woozier in the head   My office will be contacting you by phone for referral to Dr Harriette Ohara at [facility]

## 2021-07-10 ENCOUNTER — Encounter: Payer: Self-pay | Admitting: Internal Medicine

## 2021-07-10 NOTE — Assessment & Plan Note (Addendum)
Trial of gabapentin to 100 mg qid and max gerd rx 05/28/2021 >>>  - 06/25/21 neg ent eval [place] no better with nasal sterois or atrovent - 07/09/2021 referred to [facility] Gavia at [facility] with rec titrate up gabapentin to max of 1200 mg daily if tol   No response so far to efforts to treat rhinitis or gerd and prevent upper airway cough   Of the three most common causes of  Sub-acute / recurrent or chronic cough, only one (GERD)  can actually contribute to/ trigger  the other two (asthma and post nasal drip syndrome)  and perpetuate the cylce of cough.  While not intuitively obvious, many patients with chronic low grade reflux do not cough until there is a primary insult that disturbs the protective epithelial barrier and exposes sensitive nerve endings.   This is typically viral but can due to PNDS and  either may apply here.  >>>   The point is that once this occurs, it is difficult to eliminate the cycle  using anything but a maximally effective acid suppression regimen at least in the short run, accompanied by an appropriate diet to address non acid GERD and control / eliminate the cough itself with gabapentin titrate to max of 300 mg qid or whatever dose she can tol by increasing by 100 mg per day per week and go ahead and refer to DR  Bettina Gavia for a second opinion re her globus sensation which is newly acquired in last 6 months

## 2021-07-10 NOTE — Assessment & Plan Note (Signed)
Onset ? Around 2009  - Allergy profile 07/27/17  >  Eos 0.1 /  IgE  4 RAST neg  - spirometry 08/23/17 nl x mild concavity to f/v loop  - 04/09/2019 off brovana x one month with no increase doe or need for saba so left off it but continue on pulmicort 0.25 mg bid  > free of limiting symptoms 11/02/2019  - 11/19/2020 added back bid albuterol with each bud > did not do as of 05/27/2021    No response to rx fro asthma including prednisone so I doubt there is much here and it may be that the ICS contributing to UACS so will just use prn saba for now and monitor for increase need:  Re SABA :  I spent extra time with pt today reviewing appropriate use of albuterol for prn use on exertion with the following points: 1) saba is for relief of sob that does not improve by walking a slower pace or resting but rather if the pt does not improve after trying this first. 2) If the pt is convinced, as many are, that saba helps recover from activity faster then it's easy to tell if this is the case by re-challenging : ie stop, take the inhaler, then p 5 minutes try the exact same activity (intensity of workload) that just caused the symptoms and see if they are substantially diminished or not after saba 3) if there is an activity that reproducibly causes the symptoms, try the saba 15 min before the activity on alternate days   If in fact the saba really does help, then fine to continue to use it prn but advised may need to look closer at the maintenance regimen(for now which = none)  being used to achieve better control of airways disease with exertion.           Each maintenance medication was reviewed in detail including emphasizing most importantly the difference between maintenance and prns and under what circumstances the prns are to be triggered using an action plan format where appropriate.  Total time for H and P, chart review, counseling, reviewing neb/02 device(s) and generating customized AVS unique to this  office visit / same day charting > 30 min

## 2021-07-10 NOTE — Assessment & Plan Note (Signed)
06/28/2018 Patient saturation on room air at rest = 94% >>>  while ambulating = 84% x 125 ft slow pace >  Placed on 2 liters of O2 while ambulating = 97% - 08/09/2018  Did not desaturate walking 250 ft though was sob  - 04/09/2019   Walked RA x one lap =  approx 250 ft -@ slow pace stopped due to 86%   - 05/27/2021 no desats walking 100 ft slow and flat but stopped p 100 ft due to sob  As of 07/09/2021 rec 2lpm hs and prn daytime if sats dip < 90% with activity

## 2021-07-19 ENCOUNTER — Other Ambulatory Visit: Payer: Self-pay | Admitting: Internal Medicine

## 2021-07-19 ENCOUNTER — Other Ambulatory Visit: Payer: Self-pay | Admitting: Cardiology

## 2021-07-26 DIAGNOSIS — I10 Essential (primary) hypertension: Secondary | ICD-10-CM | POA: Diagnosis not present

## 2021-07-26 DIAGNOSIS — M47812 Spondylosis without myelopathy or radiculopathy, cervical region: Secondary | ICD-10-CM | POA: Diagnosis not present

## 2021-07-26 DIAGNOSIS — S0292XA Unspecified fracture of facial bones, initial encounter for closed fracture: Secondary | ICD-10-CM | POA: Diagnosis not present

## 2021-07-26 DIAGNOSIS — S022XXA Fracture of nasal bones, initial encounter for closed fracture: Secondary | ICD-10-CM | POA: Diagnosis not present

## 2021-07-26 DIAGNOSIS — M47813 Spondylosis without myelopathy or radiculopathy, cervicothoracic region: Secondary | ICD-10-CM | POA: Diagnosis not present

## 2021-07-26 DIAGNOSIS — S02652A Fracture of angle of left mandible, initial encounter for closed fracture: Secondary | ICD-10-CM | POA: Diagnosis not present

## 2021-07-26 DIAGNOSIS — S0240DA Maxillary fracture, left side, initial encounter for closed fracture: Secondary | ICD-10-CM | POA: Diagnosis not present

## 2021-07-26 DIAGNOSIS — M7989 Other specified soft tissue disorders: Secondary | ICD-10-CM | POA: Diagnosis not present

## 2021-07-26 DIAGNOSIS — S199XXA Unspecified injury of neck, initial encounter: Secondary | ICD-10-CM | POA: Diagnosis not present

## 2021-07-26 DIAGNOSIS — W1839XA Other fall on same level, initial encounter: Secondary | ICD-10-CM | POA: Diagnosis not present

## 2021-07-26 DIAGNOSIS — M25562 Pain in left knee: Secondary | ICD-10-CM | POA: Diagnosis not present

## 2021-07-26 DIAGNOSIS — S0181XA Laceration without foreign body of other part of head, initial encounter: Secondary | ICD-10-CM | POA: Diagnosis not present

## 2021-07-26 DIAGNOSIS — S0232XA Fracture of orbital floor, left side, initial encounter for closed fracture: Secondary | ICD-10-CM | POA: Diagnosis not present

## 2021-07-26 DIAGNOSIS — S8992XA Unspecified injury of left lower leg, initial encounter: Secondary | ICD-10-CM | POA: Diagnosis not present

## 2021-07-29 DIAGNOSIS — Z299 Encounter for prophylactic measures, unspecified: Secondary | ICD-10-CM | POA: Diagnosis not present

## 2021-07-29 DIAGNOSIS — E039 Hypothyroidism, unspecified: Secondary | ICD-10-CM | POA: Diagnosis not present

## 2021-07-29 DIAGNOSIS — I10 Essential (primary) hypertension: Secondary | ICD-10-CM | POA: Diagnosis not present

## 2021-07-29 DIAGNOSIS — Z6831 Body mass index (BMI) 31.0-31.9, adult: Secondary | ICD-10-CM | POA: Diagnosis not present

## 2021-07-29 DIAGNOSIS — S0292XS Unspecified fracture of facial bones, sequela: Secondary | ICD-10-CM | POA: Diagnosis not present

## 2021-07-29 DIAGNOSIS — E7800 Pure hypercholesterolemia, unspecified: Secondary | ICD-10-CM | POA: Diagnosis not present

## 2021-07-30 ENCOUNTER — Telehealth: Payer: Self-pay | Admitting: *Deleted

## 2021-07-30 NOTE — Telephone Encounter (Signed)
Patient's daughter is calling regarding a referral that she said was supposed to be sent to an ENT doctor on her last visit on 6/1.  Patient still has not heard anything yet.  Please call patient to give an update.  Daughter's # [phone]

## 2021-07-30 NOTE — Telephone Encounter (Signed)
Called patients daughter and looked at the referral that was placed on 07/09/21 by Dr Sherene Sires. I gave the daughter the number of [facility] ENT office and advised her to call the office and try to get a status of the referral and where she is at in line. Patient daughter verbalized understanding. Nothing further needed

## 2021-07-31 ENCOUNTER — Telehealth: Payer: Self-pay | Admitting: Internal Medicine

## 2021-07-31 DIAGNOSIS — J9611 Chronic respiratory failure with hypoxia: Secondary | ICD-10-CM

## 2021-07-31 DIAGNOSIS — R058 Other specified cough: Secondary | ICD-10-CM

## 2021-08-03 NOTE — Telephone Encounter (Signed)
I called the daughter per DPR and she voices understanding. The ENT referral has been placed. Nothing further needed.

## 2021-08-17 ENCOUNTER — Other Ambulatory Visit: Payer: Self-pay | Admitting: Internal Medicine

## 2021-08-25 ENCOUNTER — Other Ambulatory Visit: Payer: Self-pay | Admitting: Internal Medicine

## 2021-09-15 ENCOUNTER — Other Ambulatory Visit: Payer: Self-pay | Admitting: Cardiology

## 2021-10-05 DIAGNOSIS — H401132 Primary open-angle glaucoma, bilateral, moderate stage: Secondary | ICD-10-CM | POA: Diagnosis not present

## 2021-10-14 ENCOUNTER — Other Ambulatory Visit: Payer: Self-pay | Admitting: Cardiology

## 2021-10-30 DIAGNOSIS — E039 Hypothyroidism, unspecified: Secondary | ICD-10-CM | POA: Diagnosis not present

## 2021-10-30 DIAGNOSIS — R5383 Other fatigue: Secondary | ICD-10-CM | POA: Diagnosis not present

## 2021-10-30 DIAGNOSIS — Z0000 Encounter for general adult medical examination without abnormal findings: Secondary | ICD-10-CM | POA: Diagnosis not present

## 2021-10-30 DIAGNOSIS — I10 Essential (primary) hypertension: Secondary | ICD-10-CM | POA: Diagnosis not present

## 2021-10-30 DIAGNOSIS — Z6831 Body mass index (BMI) 31.0-31.9, adult: Secondary | ICD-10-CM | POA: Diagnosis not present

## 2021-10-30 DIAGNOSIS — Z23 Encounter for immunization: Secondary | ICD-10-CM | POA: Diagnosis not present

## 2021-10-30 DIAGNOSIS — E7800 Pure hypercholesterolemia, unspecified: Secondary | ICD-10-CM | POA: Diagnosis not present

## 2021-10-30 DIAGNOSIS — Z299 Encounter for prophylactic measures, unspecified: Secondary | ICD-10-CM | POA: Diagnosis not present

## 2021-11-10 DIAGNOSIS — H401132 Primary open-angle glaucoma, bilateral, moderate stage: Secondary | ICD-10-CM | POA: Diagnosis not present

## 2021-11-15 ENCOUNTER — Other Ambulatory Visit: Payer: Self-pay | Admitting: Cardiology

## 2021-11-15 ENCOUNTER — Other Ambulatory Visit: Payer: Self-pay | Admitting: Internal Medicine

## 2021-11-17 DIAGNOSIS — R350 Frequency of micturition: Secondary | ICD-10-CM | POA: Diagnosis not present

## 2021-11-17 DIAGNOSIS — Z299 Encounter for prophylactic measures, unspecified: Secondary | ICD-10-CM | POA: Diagnosis not present

## 2021-11-17 DIAGNOSIS — N390 Urinary tract infection, site not specified: Secondary | ICD-10-CM | POA: Diagnosis not present

## 2021-11-17 DIAGNOSIS — I10 Essential (primary) hypertension: Secondary | ICD-10-CM | POA: Diagnosis not present

## 2021-11-17 DIAGNOSIS — Z789 Other specified health status: Secondary | ICD-10-CM | POA: Diagnosis not present

## 2021-12-16 ENCOUNTER — Other Ambulatory Visit: Payer: Self-pay | Admitting: Internal Medicine

## 2021-12-22 DIAGNOSIS — R0602 Shortness of breath: Secondary | ICD-10-CM | POA: Diagnosis not present

## 2021-12-28 DIAGNOSIS — E559 Vitamin D deficiency, unspecified: Secondary | ICD-10-CM | POA: Diagnosis not present

## 2021-12-28 DIAGNOSIS — M810 Age-related osteoporosis without current pathological fracture: Secondary | ICD-10-CM | POA: Diagnosis not present

## 2022-01-07 DIAGNOSIS — I878 Other specified disorders of veins: Secondary | ICD-10-CM | POA: Diagnosis not present

## 2022-01-07 DIAGNOSIS — Z299 Encounter for prophylactic measures, unspecified: Secondary | ICD-10-CM | POA: Diagnosis not present

## 2022-01-07 DIAGNOSIS — R109 Unspecified abdominal pain: Secondary | ICD-10-CM | POA: Diagnosis not present

## 2022-01-07 DIAGNOSIS — I10 Essential (primary) hypertension: Secondary | ICD-10-CM | POA: Diagnosis not present

## 2022-01-07 DIAGNOSIS — H401132 Primary open-angle glaucoma, bilateral, moderate stage: Secondary | ICD-10-CM | POA: Diagnosis not present

## 2022-01-07 DIAGNOSIS — I700 Atherosclerosis of aorta: Secondary | ICD-10-CM | POA: Diagnosis not present

## 2022-01-07 DIAGNOSIS — M47816 Spondylosis without myelopathy or radiculopathy, lumbar region: Secondary | ICD-10-CM | POA: Diagnosis not present

## 2022-01-07 DIAGNOSIS — I25119 Atherosclerotic heart disease of native coronary artery with unspecified angina pectoris: Secondary | ICD-10-CM | POA: Diagnosis not present

## 2022-01-14 ENCOUNTER — Other Ambulatory Visit: Payer: Self-pay | Admitting: Cardiology

## 2022-01-21 DIAGNOSIS — E538 Deficiency of other specified B group vitamins: Secondary | ICD-10-CM | POA: Diagnosis not present

## 2022-01-21 DIAGNOSIS — N183 Chronic kidney disease, stage 3 unspecified: Secondary | ICD-10-CM | POA: Diagnosis not present

## 2022-01-21 DIAGNOSIS — I10 Essential (primary) hypertension: Secondary | ICD-10-CM | POA: Diagnosis not present

## 2022-01-21 DIAGNOSIS — Z789 Other specified health status: Secondary | ICD-10-CM | POA: Diagnosis not present

## 2022-01-21 DIAGNOSIS — E6609 Other obesity due to excess calories: Secondary | ICD-10-CM | POA: Diagnosis not present

## 2022-01-21 DIAGNOSIS — I25119 Atherosclerotic heart disease of native coronary artery with unspecified angina pectoris: Secondary | ICD-10-CM | POA: Diagnosis not present

## 2022-01-21 DIAGNOSIS — I700 Atherosclerosis of aorta: Secondary | ICD-10-CM | POA: Diagnosis not present

## 2022-01-21 DIAGNOSIS — Z299 Encounter for prophylactic measures, unspecified: Secondary | ICD-10-CM | POA: Diagnosis not present

## 2022-01-21 DIAGNOSIS — Z0000 Encounter for general adult medical examination without abnormal findings: Secondary | ICD-10-CM | POA: Diagnosis not present

## 2022-01-21 DIAGNOSIS — Z6832 Body mass index (BMI) 32.0-32.9, adult: Secondary | ICD-10-CM | POA: Diagnosis not present

## 2022-01-27 ENCOUNTER — Other Ambulatory Visit: Payer: Self-pay | Admitting: Internal Medicine

## 2022-01-27 DIAGNOSIS — E7800 Pure hypercholesterolemia, unspecified: Secondary | ICD-10-CM | POA: Diagnosis not present

## 2022-01-27 DIAGNOSIS — Z79899 Other long term (current) drug therapy: Secondary | ICD-10-CM | POA: Diagnosis not present

## 2022-01-27 DIAGNOSIS — E039 Hypothyroidism, unspecified: Secondary | ICD-10-CM | POA: Diagnosis not present

## 2022-01-27 DIAGNOSIS — R5383 Other fatigue: Secondary | ICD-10-CM | POA: Diagnosis not present

## 2022-02-16 ENCOUNTER — Other Ambulatory Visit: Payer: Self-pay | Admitting: Cardiology

## 2022-02-19 DIAGNOSIS — J9611 Chronic respiratory failure with hypoxia: Secondary | ICD-10-CM | POA: Diagnosis not present

## 2022-02-19 DIAGNOSIS — R059 Cough, unspecified: Secondary | ICD-10-CM | POA: Diagnosis not present

## 2022-02-19 DIAGNOSIS — J441 Chronic obstructive pulmonary disease with (acute) exacerbation: Secondary | ICD-10-CM | POA: Diagnosis not present

## 2022-02-19 DIAGNOSIS — Z299 Encounter for prophylactic measures, unspecified: Secondary | ICD-10-CM | POA: Diagnosis not present

## 2022-02-19 DIAGNOSIS — R509 Fever, unspecified: Secondary | ICD-10-CM | POA: Diagnosis not present

## 2022-02-19 DIAGNOSIS — R5383 Other fatigue: Secondary | ICD-10-CM | POA: Diagnosis not present

## 2022-02-19 DIAGNOSIS — I700 Atherosclerosis of aorta: Secondary | ICD-10-CM | POA: Diagnosis not present

## 2022-02-23 DIAGNOSIS — R1314 Dysphagia, pharyngoesophageal phase: Secondary | ICD-10-CM | POA: Diagnosis not present

## 2022-02-23 DIAGNOSIS — D1809 Hemangioma of other sites: Secondary | ICD-10-CM | POA: Diagnosis not present

## 2022-02-23 DIAGNOSIS — R490 Dysphonia: Secondary | ICD-10-CM | POA: Diagnosis not present

## 2022-02-23 DIAGNOSIS — R600 Localized edema: Secondary | ICD-10-CM | POA: Diagnosis not present

## 2022-02-23 DIAGNOSIS — R0989 Other specified symptoms and signs involving the circulatory and respiratory systems: Secondary | ICD-10-CM | POA: Diagnosis not present

## 2022-03-15 DIAGNOSIS — N390 Urinary tract infection, site not specified: Secondary | ICD-10-CM | POA: Diagnosis not present

## 2022-03-15 DIAGNOSIS — Z299 Encounter for prophylactic measures, unspecified: Secondary | ICD-10-CM | POA: Diagnosis not present

## 2022-03-15 DIAGNOSIS — I700 Atherosclerosis of aorta: Secondary | ICD-10-CM | POA: Diagnosis not present

## 2022-03-15 DIAGNOSIS — I10 Essential (primary) hypertension: Secondary | ICD-10-CM | POA: Diagnosis not present

## 2022-03-15 DIAGNOSIS — R350 Frequency of micturition: Secondary | ICD-10-CM | POA: Diagnosis not present

## 2022-03-16 ENCOUNTER — Other Ambulatory Visit: Payer: Self-pay | Admitting: Cardiology

## 2022-04-06 NOTE — Progress Notes (Unsigned)
    Cardiology Office Note  Date: 04/07/2022   ID: Belinda [name], DOB 01-Aug-1925, MRN [id number]  History of Present Illness: Belinda [name] is a 89 y.o. female last seen in May 2022.  She is here with her daughter for a follow-up visit.  From a cardiac perspective, she does not report any obvious angina.  No palpitations or syncope.  She uses a walker to ambulate, denies any recent falls.  She has been having some trouble with apparent upper airway congestion, drainage in her throat.  She did see an ENT provider in [place].  Also continues to follow with [facility].  I reviewed her medications.  She reports compliance with therapy.  Also using supplemental oxygen at nighttime as before.  I went over her recent lab work.  Today's ECG shows sinus rhythm with poor R wave progression as before.  Physical Exam: VS:  BP 110/64   Pulse 63   Ht 5' (1.524 m)   Wt 158 lb 12.8 oz (72 kg)   SpO2 94%   BMI 31.01 kg/m , BMI Body mass index is 31.01 kg/m.  Wt Readings from Last 3 Encounters:  04/07/22 158 lb 12.8 oz (72 kg)  07/09/21 153 lb 12.8 oz (69.8 kg)  05/27/21 148 lb 3.2 oz (67.2 kg)    General: Patient appears comfortable at rest. HEENT: Conjunctiva and lids normal. Lungs: Clear to auscultation, nonlabored breathing at rest. Cardiac: Regular rate and rhythm, no S3 or significant systolic murmur.  ECG:  An ECG dated 06/23/2020 was personally reviewed today and demonstrated:  Sinus rhythm with poor R wave progression.  Labwork:  December 2023: Cholesterol 196, triglyceride 217, HDL 47, LDL 111, TSH 4.19 February 2022: Hemoglobin 12.6, platelets 139, potassium 4.4, BUN 17, creatinine 1.03, AST 16, ALT 24  Other Studies Reviewed Today:  No interval cardiac testing for review today.  Assessment and Plan:  1.  CAD status post DES to the LAD and circumflex in 2009.  We continue with medical therapy and observation in the absence of accelerating angina.  ECG reviewed  and stable.  Continue bisoprolol, Norvasc, Imdur, and Pravachol.  2.  Mixed hyperlipidemia, LDL 111 in December 2023.  I did not change her current dose of Pravachol.  Disposition:  Follow up  6 months.  Signed, Satira Sark, M.D., F.A.C.C.

## 2022-04-07 ENCOUNTER — Ambulatory Visit: Payer: Medicare PPO | Attending: Cardiology | Admitting: Cardiology

## 2022-04-07 ENCOUNTER — Encounter: Payer: Self-pay | Admitting: Cardiology

## 2022-04-07 VITALS — BP 110/64 | HR 63 | Ht 60.0 in | Wt 158.8 lb

## 2022-04-07 DIAGNOSIS — E782 Mixed hyperlipidemia: Secondary | ICD-10-CM | POA: Diagnosis not present

## 2022-04-07 DIAGNOSIS — I25119 Atherosclerotic heart disease of native coronary artery with unspecified angina pectoris: Secondary | ICD-10-CM

## 2022-04-07 NOTE — Patient Instructions (Addendum)

## 2022-04-29 IMAGING — CT CT ABD-PELV W/ CM
2 of 5 series · 16 of 46 positions shown, 18 images · IV contrast (omnipaque)
Comparison: None.

CLINICAL DATA: Lower abdominal pain with diarrhea times 2-3 months.

EXAM:
CT ABDOMEN AND PELVIS WITH CONTRAST
TECHNIQUE: Multidetector CT imaging of the abdomen and pelvis was performed
using the standard protocol following bolus administration of
intravenous contrast.
CONTRAST:  80mL OMNIPAQUE IOHEXOL 300 MG/ML  SOLN

[Series 2: axial st · axial · 0.81mm/px · z∈[+753,+1103]mm · 13 of 80 slices shown, 15 images]
[im 5/80  soft-tissue]
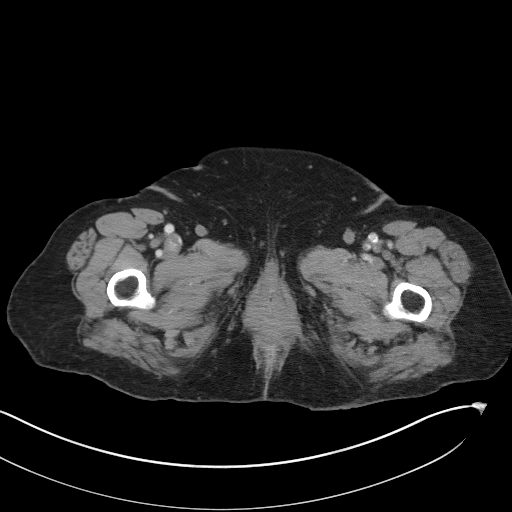
[im 5/80  bone]
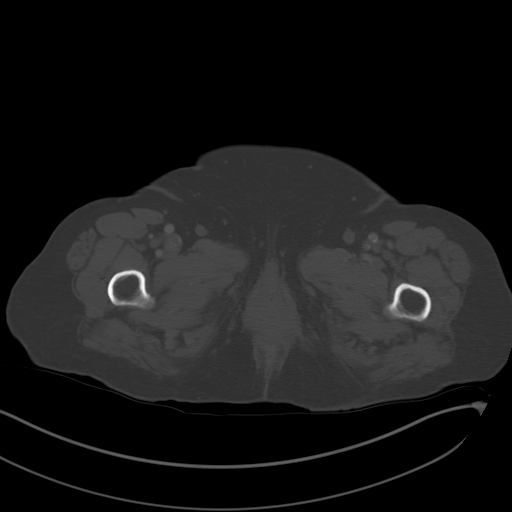
[im 13/80  soft-tissue]
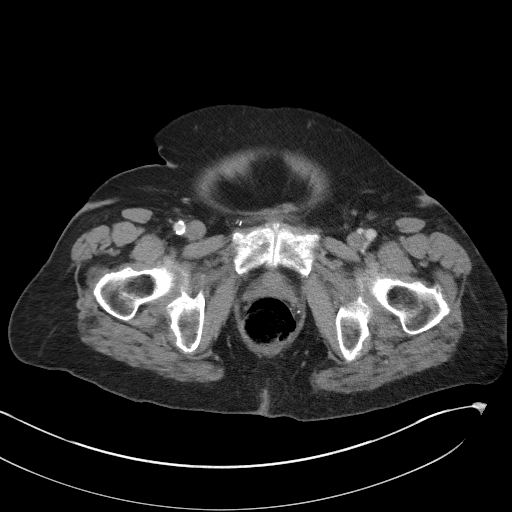
[im 17/80  soft-tissue]
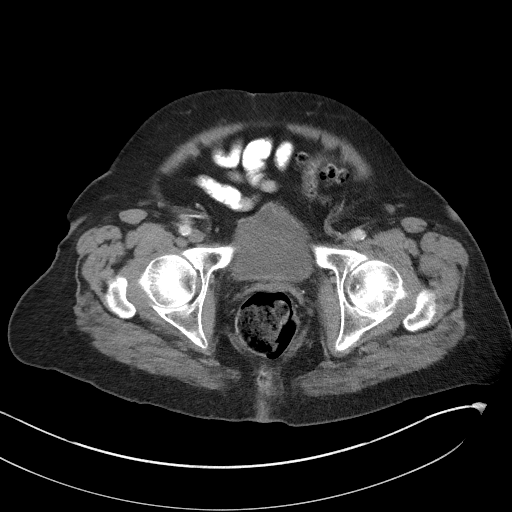
[im 21/80  soft-tissue]
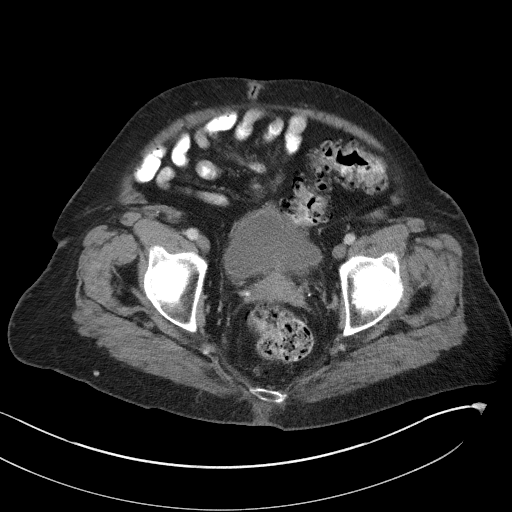
[im 30/80  soft-tissue]
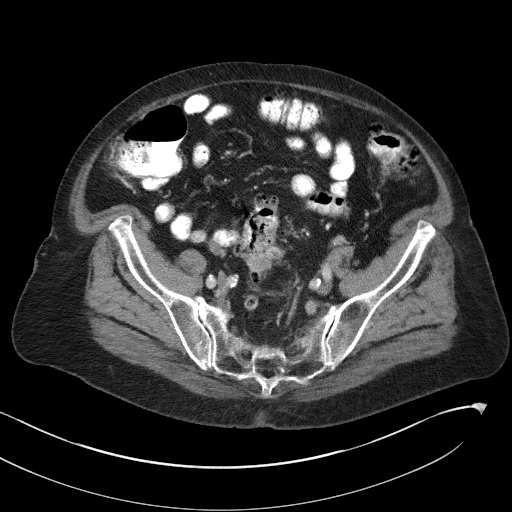
[im 34/80  soft-tissue]
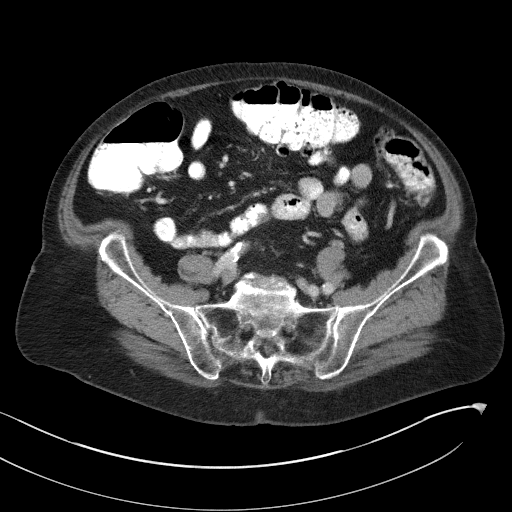
[im 42/80  soft-tissue]
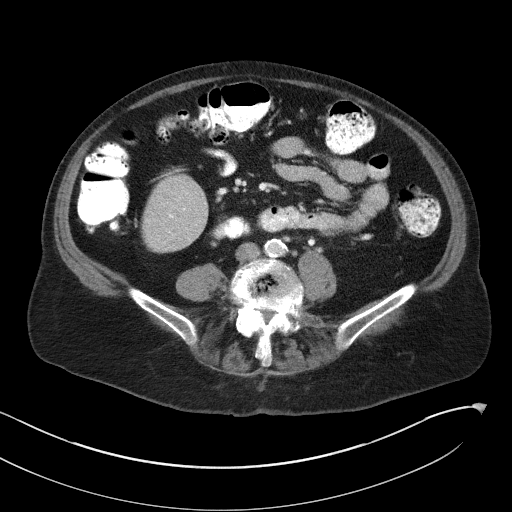
[im 46/80  soft-tissue]
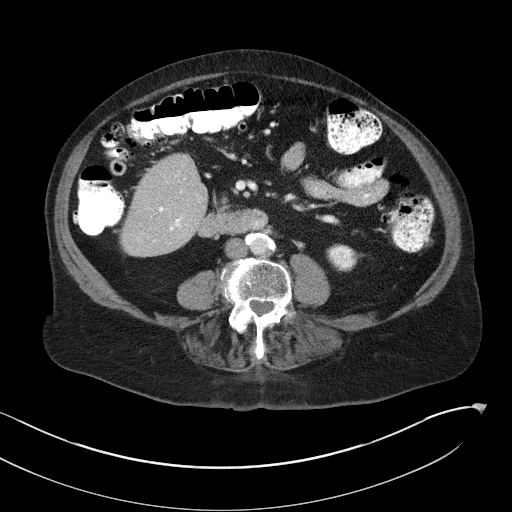
[im 50/80  soft-tissue]
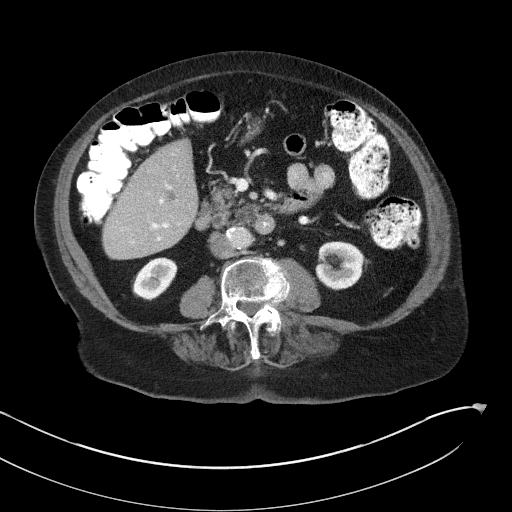
[im 50/80  bone]
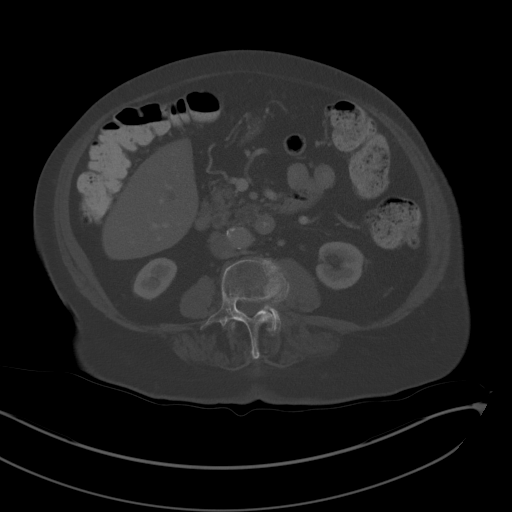
[im 59/80  soft-tissue]
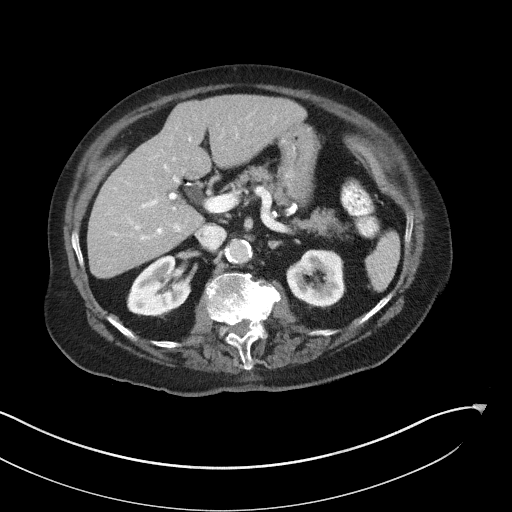
[im 63/80  soft-tissue]
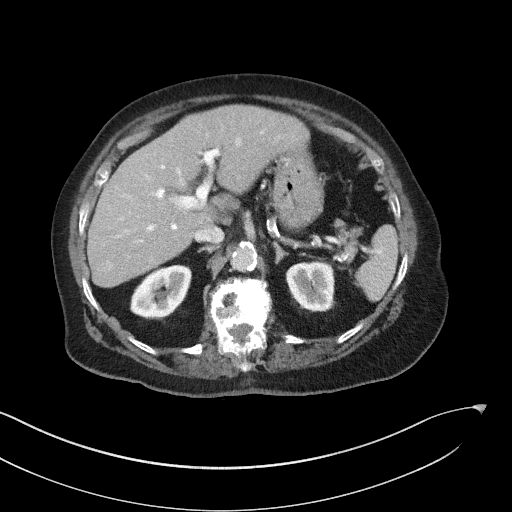
[im 67/80  soft-tissue]
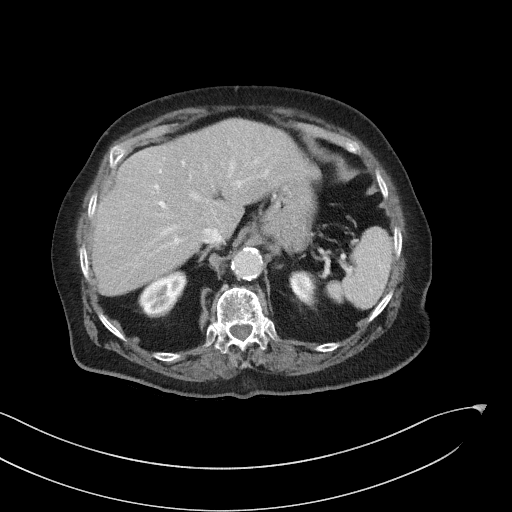
[im 75/80  soft-tissue]
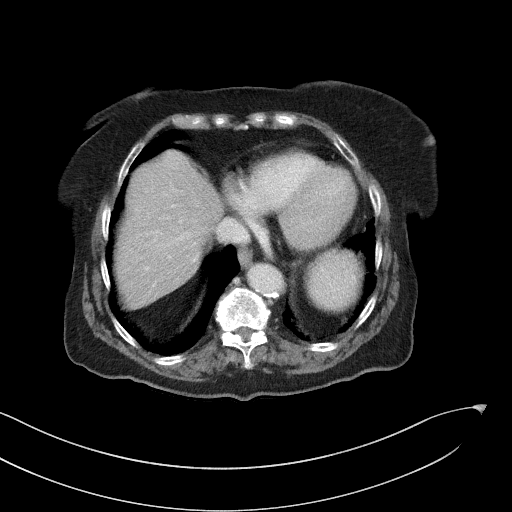

[Series 5: coronal st · coronal · 0.77mm/px · 3 of 105 slices shown]
[im 35/105  soft-tissue]
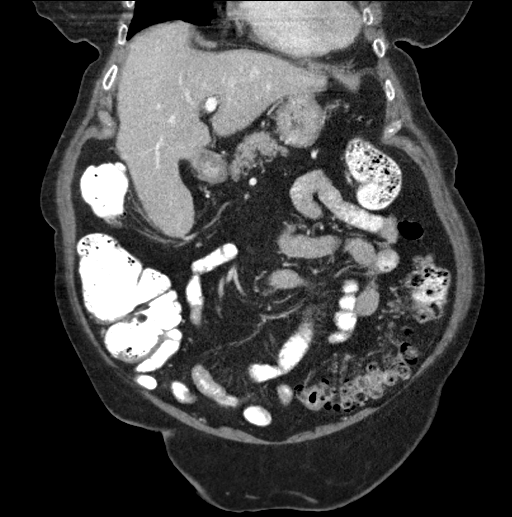
[im 47/105  soft-tissue]
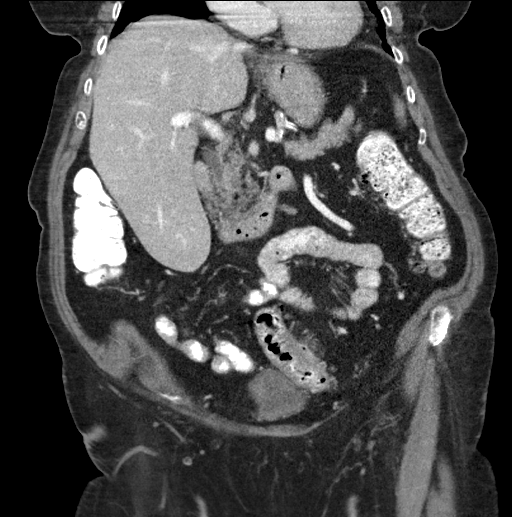
[im 58/105  soft-tissue]
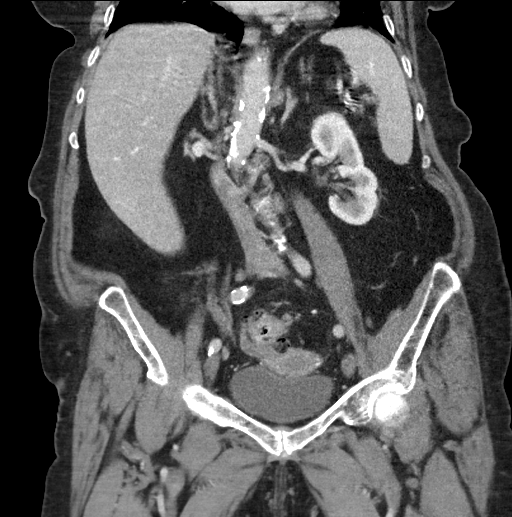

[16 of 46 positions shown; findings below may reference images not displayed]

FINDINGS: Lower chest: The lung bases are clear. The heart is enlarged.

Hepatobiliary: The liver is normal. Normal gallbladder.There is no
biliary ductal dilation.

Pancreas: Normal contours without ductal dilatation. No
peripancreatic fluid collection.

Spleen: Unremarkable.

Adrenals/Urinary Tract:

--Adrenal glands: Unremarkable.

--Right kidney/ureter: No hydronephrosis or radiopaque kidney
stones.

--Left kidney/ureter: No hydronephrosis or radiopaque kidney stones.

--Urinary bladder: There is bladder wall thickening with
questionable adjacent fat stranding.

Stomach/Bowel:

--Stomach/Duodenum: No hiatal hernia or other gastric abnormality.
Normal duodenal course and caliber.

--Small bowel: Unremarkable.

--Colon: There is severe sigmoid diverticulosis definite CT evidence
for diverticulitis.

--Appendix: Not visualized. No right lower quadrant inflammation or
free fluid.

Vascular/Lymphatic: Atherosclerotic calcification is present within
the non-aneurysmal abdominal aorta, without hemodynamically
significant stenosis.

--No retroperitoneal lymphadenopathy.

--No mesenteric lymphadenopathy.

--No pelvic or inguinal lymphadenopathy.

Reproductive: Unremarkable

Other: No ascites or free air. The abdominal wall is normal.

Musculoskeletal. No acute displaced fractures. Advanced degenerative
changes are noted throughout the lumbar spine.
IMPRESSION: 1. Wall thickening of the urinary bladder with questionable adjacent
fat stranding. Recommend correlation with urinalysis to exclude
cystitis.
2. Severe sigmoid diverticulosis without CT evidence for
diverticulitis.
3. Cardiomegaly.

Aortic Atherosclerosis ([id number]).

## 2022-05-19 DIAGNOSIS — H401132 Primary open-angle glaucoma, bilateral, moderate stage: Secondary | ICD-10-CM | POA: Diagnosis not present

## 2022-06-24 DIAGNOSIS — Z299 Encounter for prophylactic measures, unspecified: Secondary | ICD-10-CM | POA: Diagnosis not present

## 2022-06-24 DIAGNOSIS — N390 Urinary tract infection, site not specified: Secondary | ICD-10-CM | POA: Diagnosis not present

## 2022-06-24 DIAGNOSIS — N183 Chronic kidney disease, stage 3 unspecified: Secondary | ICD-10-CM | POA: Diagnosis not present

## 2022-06-24 DIAGNOSIS — B3731 Acute candidiasis of vulva and vagina: Secondary | ICD-10-CM | POA: Diagnosis not present

## 2022-07-06 DIAGNOSIS — R5383 Other fatigue: Secondary | ICD-10-CM | POA: Diagnosis not present

## 2022-07-08 DIAGNOSIS — N390 Urinary tract infection, site not specified: Secondary | ICD-10-CM | POA: Diagnosis not present

## 2022-07-08 DIAGNOSIS — Z299 Encounter for prophylactic measures, unspecified: Secondary | ICD-10-CM | POA: Diagnosis not present

## 2022-07-08 DIAGNOSIS — I10 Essential (primary) hypertension: Secondary | ICD-10-CM | POA: Diagnosis not present

## 2022-07-12 DIAGNOSIS — E559 Vitamin D deficiency, unspecified: Secondary | ICD-10-CM | POA: Diagnosis not present

## 2022-07-12 DIAGNOSIS — M810 Age-related osteoporosis without current pathological fracture: Secondary | ICD-10-CM | POA: Diagnosis not present

## 2022-07-19 DIAGNOSIS — H6123 Impacted cerumen, bilateral: Secondary | ICD-10-CM | POA: Diagnosis not present

## 2022-07-23 DIAGNOSIS — E669 Obesity, unspecified: Secondary | ICD-10-CM | POA: Diagnosis not present

## 2022-07-23 DIAGNOSIS — E785 Hyperlipidemia, unspecified: Secondary | ICD-10-CM | POA: Diagnosis not present

## 2022-07-23 DIAGNOSIS — M545 Low back pain, unspecified: Secondary | ICD-10-CM | POA: Diagnosis not present

## 2022-07-23 DIAGNOSIS — K219 Gastro-esophageal reflux disease without esophagitis: Secondary | ICD-10-CM | POA: Diagnosis not present

## 2022-07-23 DIAGNOSIS — R32 Unspecified urinary incontinence: Secondary | ICD-10-CM | POA: Diagnosis not present

## 2022-07-23 DIAGNOSIS — K589 Irritable bowel syndrome without diarrhea: Secondary | ICD-10-CM | POA: Diagnosis not present

## 2022-07-23 DIAGNOSIS — M810 Age-related osteoporosis without current pathological fracture: Secondary | ICD-10-CM | POA: Diagnosis not present

## 2022-07-23 DIAGNOSIS — Z961 Presence of intraocular lens: Secondary | ICD-10-CM | POA: Diagnosis not present

## 2022-07-23 DIAGNOSIS — E039 Hypothyroidism, unspecified: Secondary | ICD-10-CM | POA: Diagnosis not present

## 2022-07-23 DIAGNOSIS — F0284 Dementia in other diseases classified elsewhere, unspecified severity, with anxiety: Secondary | ICD-10-CM | POA: Diagnosis not present

## 2022-07-23 DIAGNOSIS — H409 Unspecified glaucoma: Secondary | ICD-10-CM | POA: Diagnosis not present

## 2022-07-23 DIAGNOSIS — N189 Chronic kidney disease, unspecified: Secondary | ICD-10-CM | POA: Diagnosis not present

## 2022-08-04 DIAGNOSIS — N390 Urinary tract infection, site not specified: Secondary | ICD-10-CM | POA: Diagnosis not present

## 2022-08-04 DIAGNOSIS — R350 Frequency of micturition: Secondary | ICD-10-CM | POA: Diagnosis not present

## 2022-08-04 DIAGNOSIS — Z299 Encounter for prophylactic measures, unspecified: Secondary | ICD-10-CM | POA: Diagnosis not present

## 2022-08-04 DIAGNOSIS — I10 Essential (primary) hypertension: Secondary | ICD-10-CM | POA: Diagnosis not present

## 2022-08-04 DIAGNOSIS — R600 Localized edema: Secondary | ICD-10-CM | POA: Diagnosis not present

## 2022-08-04 DIAGNOSIS — J441 Chronic obstructive pulmonary disease with (acute) exacerbation: Secondary | ICD-10-CM | POA: Diagnosis not present

## 2022-08-24 DIAGNOSIS — D1809 Hemangioma of other sites: Secondary | ICD-10-CM | POA: Diagnosis not present

## 2022-08-24 DIAGNOSIS — R1314 Dysphagia, pharyngoesophageal phase: Secondary | ICD-10-CM | POA: Diagnosis not present

## 2022-08-24 DIAGNOSIS — R490 Dysphonia: Secondary | ICD-10-CM | POA: Diagnosis not present

## 2022-09-06 DIAGNOSIS — H6123 Impacted cerumen, bilateral: Secondary | ICD-10-CM | POA: Diagnosis not present

## 2022-09-09 ENCOUNTER — Other Ambulatory Visit: Payer: Self-pay | Admitting: Internal Medicine

## 2022-09-21 DIAGNOSIS — R21 Rash and other nonspecific skin eruption: Secondary | ICD-10-CM | POA: Diagnosis not present

## 2022-09-21 DIAGNOSIS — Z299 Encounter for prophylactic measures, unspecified: Secondary | ICD-10-CM | POA: Diagnosis not present

## 2022-09-21 DIAGNOSIS — I10 Essential (primary) hypertension: Secondary | ICD-10-CM | POA: Diagnosis not present

## 2022-09-29 DIAGNOSIS — E559 Vitamin D deficiency, unspecified: Secondary | ICD-10-CM | POA: Diagnosis not present

## 2022-09-29 DIAGNOSIS — R739 Hyperglycemia, unspecified: Secondary | ICD-10-CM | POA: Diagnosis not present

## 2022-09-29 DIAGNOSIS — R5383 Other fatigue: Secondary | ICD-10-CM | POA: Diagnosis not present

## 2022-09-29 DIAGNOSIS — E039 Hypothyroidism, unspecified: Secondary | ICD-10-CM | POA: Diagnosis not present

## 2022-09-29 DIAGNOSIS — Z1331 Encounter for screening for depression: Secondary | ICD-10-CM | POA: Diagnosis not present

## 2022-09-29 DIAGNOSIS — Z0000 Encounter for general adult medical examination without abnormal findings: Secondary | ICD-10-CM | POA: Diagnosis not present

## 2022-09-29 DIAGNOSIS — E7800 Pure hypercholesterolemia, unspecified: Secondary | ICD-10-CM | POA: Diagnosis not present

## 2022-09-29 DIAGNOSIS — Z299 Encounter for prophylactic measures, unspecified: Secondary | ICD-10-CM | POA: Diagnosis not present

## 2022-09-29 DIAGNOSIS — Z7189 Other specified counseling: Secondary | ICD-10-CM | POA: Diagnosis not present

## 2022-09-29 DIAGNOSIS — I10 Essential (primary) hypertension: Secondary | ICD-10-CM | POA: Diagnosis not present

## 2022-09-29 DIAGNOSIS — Z79899 Other long term (current) drug therapy: Secondary | ICD-10-CM | POA: Diagnosis not present

## 2022-09-29 DIAGNOSIS — Z1339 Encounter for screening examination for other mental health and behavioral disorders: Secondary | ICD-10-CM | POA: Diagnosis not present

## 2022-10-10 ENCOUNTER — Other Ambulatory Visit: Payer: Self-pay | Admitting: Cardiology

## 2022-10-13 ENCOUNTER — Ambulatory Visit: Payer: Medicare PPO | Attending: Cardiology | Admitting: Cardiology

## 2022-10-13 ENCOUNTER — Encounter: Payer: Self-pay | Admitting: Cardiology

## 2022-10-13 VITALS — BP 100/66 | HR 70 | Ht <= 58 in | Wt 161.2 lb

## 2022-10-13 DIAGNOSIS — I25119 Atherosclerotic heart disease of native coronary artery with unspecified angina pectoris: Secondary | ICD-10-CM

## 2022-10-13 DIAGNOSIS — E782 Mixed hyperlipidemia: Secondary | ICD-10-CM

## 2022-10-13 NOTE — Patient Instructions (Signed)
Medication Instructions:  Continue all current medications.   Labwork: none  Testing/Procedures: none  Follow-Up: 6 months   Any Other Special Instructions Will Be Listed Below (If Applicable).   If you need a refill on your cardiac medications before your next appointment, please call your pharmacy.  

## 2022-10-13 NOTE — Progress Notes (Signed)
    Cardiology Office Note  Date: 10/13/2022   ID: APRYLE ROETHLER, DOB 1926/02/09, MRN [id number]  History of Present Illness: Belinda [name] is a 89 y.o. female last seen in February.  She is here today with her daughter for follow-up visit.  Overall no major change in status, she is using a walker exclusively to get around at home now.  No angina with current level of activity.  No sudden dizziness or syncope.  I reviewed her medications.  Cardiac regimen includes bisoprolol, amlodipine, Imdur, and Pravachol.  She did have recent lab work as noted below and pending wellness physical with PCP.  Physical Exam: VS:  BP 100/66   Pulse 70   Ht 4\' 10"  (1.473 m)   Wt 161 lb 3.2 oz (73.1 kg)   SpO2 95%   BMI 33.69 kg/m , BMI Body mass index is 33.69 kg/m.  Wt Readings from Last 3 Encounters:  10/13/22 161 lb 3.2 oz (73.1 kg)  04/07/22 158 lb 12.8 oz (72 kg)  07/09/21 153 lb 12.8 oz (69.8 kg)    General: Patient appears comfortable at rest. HEENT: Conjunctiva and lids normal. Neck: Supple, no elevated JVP or carotid bruits. Lungs: Clear to auscultation, nonlabored breathing at rest. Cardiac: Regular rate and rhythm, no S3 or significant systolic murmur.  ECG:  An ECG dated 04/07/2022 was personally reviewed today and demonstrated:  Sinus rhythm with poor R wave progression.  Labwork:  August 2024: Hemoglobin 13.3, platelets 119, BUN 16, creatinine 1.04, potassium 5, AST 16, ALT 14, cholesterol 196, triglycerides 317, HDL 47, LDL 96, TSH 3.28, hemoglobin A1c 6.6%  Other Studies Reviewed Today:  No interval cardiac testing for review today.  Assessment and Plan:  1.  CAD status post DES to the LAD and circumflex in 2009.  She is doing well without active angina at current level of activity and we continue with medical therapy.  Currently on bisoprolol, amlodipine, Imdur, and Pravachol.  No changes were made today.   2.  Mixed hyperlipidemia, recent LDL goal 96 on  Pravachol.  Disposition:  Follow up  6 months.  Signed, [name], M.D., F.A.C.C. [facility]

## 2022-10-25 DIAGNOSIS — Z23 Encounter for immunization: Secondary | ICD-10-CM | POA: Diagnosis not present

## 2022-10-25 DIAGNOSIS — Z299 Encounter for prophylactic measures, unspecified: Secondary | ICD-10-CM | POA: Diagnosis not present

## 2022-10-25 DIAGNOSIS — I10 Essential (primary) hypertension: Secondary | ICD-10-CM | POA: Diagnosis not present

## 2022-10-25 DIAGNOSIS — R102 Pelvic and perineal pain: Secondary | ICD-10-CM | POA: Diagnosis not present

## 2022-10-25 DIAGNOSIS — B354 Tinea corporis: Secondary | ICD-10-CM | POA: Diagnosis not present

## 2022-11-02 DIAGNOSIS — H401132 Primary open-angle glaucoma, bilateral, moderate stage: Secondary | ICD-10-CM | POA: Diagnosis not present

## 2022-11-03 DIAGNOSIS — Z0000 Encounter for general adult medical examination without abnormal findings: Secondary | ICD-10-CM | POA: Diagnosis not present

## 2022-11-03 DIAGNOSIS — I10 Essential (primary) hypertension: Secondary | ICD-10-CM | POA: Diagnosis not present

## 2022-11-03 DIAGNOSIS — Z299 Encounter for prophylactic measures, unspecified: Secondary | ICD-10-CM | POA: Diagnosis not present

## 2022-11-03 DIAGNOSIS — R102 Pelvic and perineal pain: Secondary | ICD-10-CM | POA: Diagnosis not present

## 2022-11-03 DIAGNOSIS — N8110 Cystocele, unspecified: Secondary | ICD-10-CM | POA: Diagnosis not present

## 2022-11-03 DIAGNOSIS — I25119 Atherosclerotic heart disease of native coronary artery with unspecified angina pectoris: Secondary | ICD-10-CM | POA: Diagnosis not present

## 2023-01-09 ENCOUNTER — Other Ambulatory Visit: Payer: Self-pay | Admitting: Internal Medicine

## 2023-01-14 DIAGNOSIS — M810 Age-related osteoporosis without current pathological fracture: Secondary | ICD-10-CM | POA: Diagnosis not present

## 2023-01-14 DIAGNOSIS — E559 Vitamin D deficiency, unspecified: Secondary | ICD-10-CM | POA: Diagnosis not present

## 2023-01-14 DIAGNOSIS — R5383 Other fatigue: Secondary | ICD-10-CM | POA: Diagnosis not present

## 2023-01-19 DIAGNOSIS — J441 Chronic obstructive pulmonary disease with (acute) exacerbation: Secondary | ICD-10-CM | POA: Diagnosis not present

## 2023-01-19 DIAGNOSIS — J9611 Chronic respiratory failure with hypoxia: Secondary | ICD-10-CM | POA: Diagnosis not present

## 2023-01-19 DIAGNOSIS — F419 Anxiety disorder, unspecified: Secondary | ICD-10-CM | POA: Diagnosis not present

## 2023-01-19 DIAGNOSIS — G629 Polyneuropathy, unspecified: Secondary | ICD-10-CM | POA: Diagnosis not present

## 2023-01-24 DIAGNOSIS — M810 Age-related osteoporosis without current pathological fracture: Secondary | ICD-10-CM | POA: Diagnosis not present

## 2023-01-27 DIAGNOSIS — H401132 Primary open-angle glaucoma, bilateral, moderate stage: Secondary | ICD-10-CM | POA: Diagnosis not present

## 2023-03-02 DIAGNOSIS — N183 Chronic kidney disease, stage 3 unspecified: Secondary | ICD-10-CM | POA: Diagnosis not present

## 2023-03-02 DIAGNOSIS — I700 Atherosclerosis of aorta: Secondary | ICD-10-CM | POA: Diagnosis not present

## 2023-03-02 DIAGNOSIS — J441 Chronic obstructive pulmonary disease with (acute) exacerbation: Secondary | ICD-10-CM | POA: Diagnosis not present

## 2023-03-14 DIAGNOSIS — R059 Cough, unspecified: Secondary | ICD-10-CM | POA: Diagnosis not present

## 2023-03-14 DIAGNOSIS — Z299 Encounter for prophylactic measures, unspecified: Secondary | ICD-10-CM | POA: Diagnosis not present

## 2023-03-14 DIAGNOSIS — J209 Acute bronchitis, unspecified: Secondary | ICD-10-CM | POA: Diagnosis not present

## 2023-03-14 DIAGNOSIS — J440 Chronic obstructive pulmonary disease with acute lower respiratory infection: Secondary | ICD-10-CM | POA: Diagnosis not present

## 2023-03-14 DIAGNOSIS — J9611 Chronic respiratory failure with hypoxia: Secondary | ICD-10-CM | POA: Diagnosis not present

## 2023-03-14 DIAGNOSIS — J040 Acute laryngitis: Secondary | ICD-10-CM | POA: Diagnosis not present

## 2023-03-24 DIAGNOSIS — J9611 Chronic respiratory failure with hypoxia: Secondary | ICD-10-CM | POA: Diagnosis not present

## 2023-03-24 DIAGNOSIS — R490 Dysphonia: Secondary | ICD-10-CM | POA: Diagnosis not present

## 2023-03-24 DIAGNOSIS — I10 Essential (primary) hypertension: Secondary | ICD-10-CM | POA: Diagnosis not present

## 2023-03-24 DIAGNOSIS — J441 Chronic obstructive pulmonary disease with (acute) exacerbation: Secondary | ICD-10-CM | POA: Diagnosis not present

## 2023-03-24 DIAGNOSIS — N183 Chronic kidney disease, stage 3 unspecified: Secondary | ICD-10-CM | POA: Diagnosis not present

## 2023-03-24 DIAGNOSIS — Z299 Encounter for prophylactic measures, unspecified: Secondary | ICD-10-CM | POA: Diagnosis not present

## 2023-03-29 DIAGNOSIS — H401132 Primary open-angle glaucoma, bilateral, moderate stage: Secondary | ICD-10-CM | POA: Diagnosis not present

## 2023-04-19 DIAGNOSIS — R1314 Dysphagia, pharyngoesophageal phase: Secondary | ICD-10-CM | POA: Diagnosis not present

## 2023-04-19 DIAGNOSIS — R1312 Dysphagia, oropharyngeal phase: Secondary | ICD-10-CM | POA: Diagnosis not present

## 2023-04-19 DIAGNOSIS — R490 Dysphonia: Secondary | ICD-10-CM | POA: Diagnosis not present

## 2023-04-19 DIAGNOSIS — D1809 Hemangioma of other sites: Secondary | ICD-10-CM | POA: Diagnosis not present

## 2023-04-19 DIAGNOSIS — R6339 Other feeding difficulties: Secondary | ICD-10-CM | POA: Diagnosis not present

## 2023-04-19 DIAGNOSIS — R1310 Dysphagia, unspecified: Secondary | ICD-10-CM | POA: Diagnosis not present

## 2023-04-20 ENCOUNTER — Ambulatory Visit: Payer: Medicare PPO | Attending: Cardiology | Admitting: Cardiology

## 2023-04-20 ENCOUNTER — Encounter: Payer: Self-pay | Admitting: Cardiology

## 2023-04-20 VITALS — BP 122/62 | HR 64 | Ht <= 58 in | Wt 153.0 lb

## 2023-04-20 DIAGNOSIS — I25119 Atherosclerotic heart disease of native coronary artery with unspecified angina pectoris: Secondary | ICD-10-CM | POA: Diagnosis not present

## 2023-04-20 DIAGNOSIS — I700 Atherosclerosis of aorta: Secondary | ICD-10-CM | POA: Diagnosis not present

## 2023-04-20 DIAGNOSIS — E782 Mixed hyperlipidemia: Secondary | ICD-10-CM

## 2023-04-20 DIAGNOSIS — I10 Essential (primary) hypertension: Secondary | ICD-10-CM

## 2023-04-20 DIAGNOSIS — F419 Anxiety disorder, unspecified: Secondary | ICD-10-CM | POA: Diagnosis not present

## 2023-04-20 DIAGNOSIS — N183 Chronic kidney disease, stage 3 unspecified: Secondary | ICD-10-CM | POA: Diagnosis not present

## 2023-04-20 NOTE — Patient Instructions (Addendum)

## 2023-04-20 NOTE — Progress Notes (Signed)
    Cardiology Office Note  Date: 04/20/2023   ID: SHAIDA ROUTE, DOB [birth year], MRN [id number]  History of Present Illness: Belinda [name] is a 89 y.o. female last seen in September 2024.  She is here today with her daughter for a follow-up visit.  Reports no angina on current medical therapy.  No palpitations or syncope.  She continues to use a walker to ambulate.  We went over her medications.  No changes from a cardiac perspective.  I reviewed her lab work as noted below.  I reviewed her ECG today which shows normal sinus rhythm with decreased R wave progression as before.  Physical Exam: VS:  BP 122/62 (BP Location: Right Arm, Patient Position: Sitting, Cuff Size: Normal)   Pulse 64   Ht 4\' 10"  (1.473 m)   Wt 153 lb (69.4 kg)   SpO2 94%   BMI 31.98 kg/m , BMI Body mass index is 31.98 kg/m.  Wt Readings from Last 3 Encounters:  04/20/23 153 lb (69.4 kg)  10/13/22 161 lb 3.2 oz (73.1 kg)  04/07/22 158 lb 12.8 oz (72 kg)    General: Patient appears comfortable at rest. HEENT: Conjunctiva and lids normal. Neck: Supple, no elevated JVP or carotid bruits. Lungs: Clear to auscultation, nonlabored breathing at rest. Cardiac: Regular rate and rhythm, no S3 or significant systolic murmur.  ECG:  An ECG dated 04/07/2022 was personally reviewed today and demonstrated:  Sinus rhythm with poor R wave progression.  Labwork:  August 2024: Cholesterol 196, triglycerides 317, HDL 47, LDL 96, TSH 3.05 February 2023: Hemoglobin 12.2, platelets 132, potassium 4.3, BUN 14, creatinine 1.02, AST 13, ALT 24  Other Studies Reviewed Today:  No interval cardiac testing for review today.  Assessment and Plan:  1.  CAD status post DES to the LAD and circumflex in 2009.  No interval angina on medical therapy with low-level activity.  Continue Imdur 30 mg daily and Pravachol 10 mg daily.   2.  Mixed hyperlipidemia.  She remains on low-dose Pravachol, LDL 96 in August 2024.  Tenure with current  plan.  3.  Primary hypertension, blood pressure controlled today.  Continue bisoprolol 5 mg daily and Norvasc 2.5 mg daily.  Disposition:  Follow up  6 months.  Signed, [name], M.D., F.A.C.C. [facility] at [facility]

## 2023-05-08 ENCOUNTER — Other Ambulatory Visit: Payer: Self-pay | Admitting: Cardiology

## 2023-05-31 DIAGNOSIS — R52 Pain, unspecified: Secondary | ICD-10-CM | POA: Diagnosis not present

## 2023-05-31 DIAGNOSIS — H401132 Primary open-angle glaucoma, bilateral, moderate stage: Secondary | ICD-10-CM | POA: Diagnosis not present

## 2023-05-31 DIAGNOSIS — Z299 Encounter for prophylactic measures, unspecified: Secondary | ICD-10-CM | POA: Diagnosis not present

## 2023-05-31 DIAGNOSIS — I10 Essential (primary) hypertension: Secondary | ICD-10-CM | POA: Diagnosis not present

## 2023-05-31 DIAGNOSIS — N390 Urinary tract infection, site not specified: Secondary | ICD-10-CM | POA: Diagnosis not present

## 2023-05-31 DIAGNOSIS — J9611 Chronic respiratory failure with hypoxia: Secondary | ICD-10-CM | POA: Diagnosis not present

## 2023-05-31 DIAGNOSIS — N183 Chronic kidney disease, stage 3 unspecified: Secondary | ICD-10-CM | POA: Diagnosis not present

## 2023-05-31 DIAGNOSIS — R7303 Prediabetes: Secondary | ICD-10-CM | POA: Diagnosis not present

## 2023-05-31 DIAGNOSIS — J449 Chronic obstructive pulmonary disease, unspecified: Secondary | ICD-10-CM | POA: Diagnosis not present

## 2023-07-12 DIAGNOSIS — R5383 Other fatigue: Secondary | ICD-10-CM | POA: Diagnosis not present

## 2023-07-12 DIAGNOSIS — I10 Essential (primary) hypertension: Secondary | ICD-10-CM | POA: Diagnosis not present

## 2023-07-12 DIAGNOSIS — F419 Anxiety disorder, unspecified: Secondary | ICD-10-CM | POA: Diagnosis not present

## 2023-07-12 DIAGNOSIS — Z299 Encounter for prophylactic measures, unspecified: Secondary | ICD-10-CM | POA: Diagnosis not present

## 2023-07-26 DIAGNOSIS — R0602 Shortness of breath: Secondary | ICD-10-CM | POA: Diagnosis not present

## 2023-08-01 DIAGNOSIS — M8000XA Age-related osteoporosis with current pathological fracture, unspecified site, initial encounter for fracture: Secondary | ICD-10-CM | POA: Diagnosis not present

## 2023-08-01 DIAGNOSIS — E559 Vitamin D deficiency, unspecified: Secondary | ICD-10-CM | POA: Diagnosis not present

## 2023-08-04 DIAGNOSIS — Z299 Encounter for prophylactic measures, unspecified: Secondary | ICD-10-CM | POA: Diagnosis not present

## 2023-08-04 DIAGNOSIS — F419 Anxiety disorder, unspecified: Secondary | ICD-10-CM | POA: Diagnosis not present

## 2023-08-04 DIAGNOSIS — I10 Essential (primary) hypertension: Secondary | ICD-10-CM | POA: Diagnosis not present

## 2023-10-02 DIAGNOSIS — N390 Urinary tract infection, site not specified: Secondary | ICD-10-CM | POA: Diagnosis not present

## 2023-10-02 DIAGNOSIS — R300 Dysuria: Secondary | ICD-10-CM | POA: Diagnosis not present

## 2023-10-12 DIAGNOSIS — Z7189 Other specified counseling: Secondary | ICD-10-CM | POA: Diagnosis not present

## 2023-10-12 DIAGNOSIS — E6609 Other obesity due to excess calories: Secondary | ICD-10-CM | POA: Diagnosis not present

## 2023-10-12 DIAGNOSIS — M810 Age-related osteoporosis without current pathological fracture: Secondary | ICD-10-CM | POA: Diagnosis not present

## 2023-10-12 DIAGNOSIS — Z299 Encounter for prophylactic measures, unspecified: Secondary | ICD-10-CM | POA: Diagnosis not present

## 2023-10-12 DIAGNOSIS — Z6830 Body mass index (BMI) 30.0-30.9, adult: Secondary | ICD-10-CM | POA: Diagnosis not present

## 2023-10-12 DIAGNOSIS — Z713 Dietary counseling and surveillance: Secondary | ICD-10-CM | POA: Diagnosis not present

## 2023-10-12 DIAGNOSIS — R5383 Other fatigue: Secondary | ICD-10-CM | POA: Diagnosis not present

## 2023-10-12 DIAGNOSIS — Z1339 Encounter for screening examination for other mental health and behavioral disorders: Secondary | ICD-10-CM | POA: Diagnosis not present

## 2023-10-12 DIAGNOSIS — E039 Hypothyroidism, unspecified: Secondary | ICD-10-CM | POA: Diagnosis not present

## 2023-10-12 DIAGNOSIS — Z0000 Encounter for general adult medical examination without abnormal findings: Secondary | ICD-10-CM | POA: Diagnosis not present

## 2023-10-12 DIAGNOSIS — R350 Frequency of micturition: Secondary | ICD-10-CM | POA: Diagnosis not present

## 2023-10-12 DIAGNOSIS — Z1331 Encounter for screening for depression: Secondary | ICD-10-CM | POA: Diagnosis not present

## 2023-10-12 DIAGNOSIS — I10 Essential (primary) hypertension: Secondary | ICD-10-CM | POA: Diagnosis not present

## 2023-10-12 DIAGNOSIS — E7800 Pure hypercholesterolemia, unspecified: Secondary | ICD-10-CM | POA: Diagnosis not present

## 2023-10-12 DIAGNOSIS — Z79899 Other long term (current) drug therapy: Secondary | ICD-10-CM | POA: Diagnosis not present

## 2023-11-08 DIAGNOSIS — I10 Essential (primary) hypertension: Secondary | ICD-10-CM | POA: Diagnosis not present

## 2023-11-08 DIAGNOSIS — Z23 Encounter for immunization: Secondary | ICD-10-CM | POA: Diagnosis not present

## 2023-11-08 DIAGNOSIS — J449 Chronic obstructive pulmonary disease, unspecified: Secondary | ICD-10-CM | POA: Diagnosis not present

## 2023-11-08 DIAGNOSIS — I25119 Atherosclerotic heart disease of native coronary artery with unspecified angina pectoris: Secondary | ICD-10-CM | POA: Diagnosis not present

## 2023-11-08 DIAGNOSIS — N183 Chronic kidney disease, stage 3 unspecified: Secondary | ICD-10-CM | POA: Diagnosis not present

## 2023-11-08 DIAGNOSIS — Z0000 Encounter for general adult medical examination without abnormal findings: Secondary | ICD-10-CM | POA: Diagnosis not present

## 2023-11-08 DIAGNOSIS — Z299 Encounter for prophylactic measures, unspecified: Secondary | ICD-10-CM | POA: Diagnosis not present

## 2023-11-23 ENCOUNTER — Ambulatory Visit: Attending: Cardiology | Admitting: Cardiology

## 2023-11-23 ENCOUNTER — Encounter: Payer: Self-pay | Admitting: Cardiology

## 2023-11-23 VITALS — BP 132/72 | HR 62 | Ht 59.0 in | Wt 148.8 lb

## 2023-11-23 DIAGNOSIS — I25119 Atherosclerotic heart disease of native coronary artery with unspecified angina pectoris: Secondary | ICD-10-CM | POA: Diagnosis not present

## 2023-11-23 DIAGNOSIS — I10 Essential (primary) hypertension: Secondary | ICD-10-CM | POA: Diagnosis not present

## 2023-11-23 DIAGNOSIS — E782 Mixed hyperlipidemia: Secondary | ICD-10-CM | POA: Diagnosis not present

## 2023-11-23 NOTE — Patient Instructions (Addendum)

## 2023-11-23 NOTE — Progress Notes (Signed)
    Cardiology Office Note  Date: 11/23/2023   ID: MARILYNE HASELEY, DOB October 29, 1925, MRN [id number]  History of Present Illness: Belinda [name] is a 89 y.o. female last seen in March.  She is here with her daughter for follow-up visit.  She does not describe any angina on current medications.  Still at home with assistance from family and sedentary as before.  She does use a walker to get around the house, no recent falls.  I reviewed her medications which are stable from a cardiac perspective.  Blood pressure rechecked by me today was 132/70.  She had lab work in September at which point her LDL was 88 and HDL 50.  Renal function normal as well.  She is also on BuSpar since last visit per PCP, has had less anxiety per family.  Physical Exam: VS:  BP 132/72 (BP Location: Left Arm)   Pulse 62   Ht 4' 11 (1.499 m)   Wt 148 lb 12.8 oz (67.5 kg)   SpO2 95%   BMI 30.05 kg/m , BMI Body mass index is 30.05 kg/m.  Wt Readings from Last 3 Encounters:  11/23/23 148 lb 12.8 oz (67.5 kg)  04/20/23 153 lb (69.4 kg)  10/13/22 161 lb 3.2 oz (73.1 kg)    General: Patient appears comfortable at rest. HEENT: Conjunctiva and lids normal. Neck: Supple, no elevated JVP or carotid bruits. Lungs: Clear to auscultation, nonlabored breathing at rest. Cardiac: Regular rate and rhythm, no S3 or significant systolic murmur.  ECG:  An ECG dated 04/20/2023 was personally reviewed today and demonstrated:  Sinus rhythm with poor R wave progression.  Labwork:  June 2025: Hemoglobin 12.6, platelets 143, potassium 4.8, BUN 24, creatinine 0.93 September 2025: Hemoglobin 12.7, platelets 132, BUN 26, creatinine 0.89, potassium 4.1, AST 15, ALT 12, cholesterol 167, triglycerides 167, HDL 50, LDL 88, TSH 2.23  Other Studies Reviewed Today:  No interval cardiac testing for review today.  Assessment and Plan:  1.  CAD status post DES to the LAD and circumflex in 2009.  We continue medical therapy and observation.   No reported angina at current level of activity.  Continue Pravachol  20 mg daily and Imdur  30 mg daily.   2.  Mixed hyperlipidemia.  LDL 88 and HDL 58 in September.  Continue Pravachol  20 mg daily.   3.  Primary hypertension.  No change in current regimen which includes bisoprolol  5 mg daily and amlodipine  2.5 mg daily.  Disposition:  Follow up 6 months.  Signed, Jayson JUDITHANN Sierras, M.D., F.A.C.C.  [facility] at [facility]

## 2023-11-24 DIAGNOSIS — H401122 Primary open-angle glaucoma, left eye, moderate stage: Secondary | ICD-10-CM | POA: Diagnosis not present

## 2023-12-01 ENCOUNTER — Other Ambulatory Visit: Payer: Self-pay | Admitting: Cardiology

## 2024-05-21 ENCOUNTER — Ambulatory Visit: Admitting: Cardiology
